# Patient Record
Sex: Male | Born: 1995 | Hispanic: No | Marital: Single | Smoking: Never smoker
Health system: Southern US, Community
[De-identification: ages and names within clinical notes are randomized; demographics above are authoritative.]

## PROBLEM LIST (undated history)

## (undated) DIAGNOSIS — Z789 Other specified health status: Secondary | ICD-10-CM

## (undated) DIAGNOSIS — D573 Sickle-cell trait: Secondary | ICD-10-CM

## (undated) HISTORY — PX: NO PAST SURGERIES: SHX2092

## (undated) HISTORY — DX: Sickle-cell trait: D57.3

---
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
## 2015-02-10 ENCOUNTER — Ambulatory Visit (INDEPENDENT_AMBULATORY_CARE_PROVIDER_SITE_OTHER): Payer: Medicaid Other | Admitting: Pediatrics

## 2015-02-10 ENCOUNTER — Encounter (INDEPENDENT_AMBULATORY_CARE_PROVIDER_SITE_OTHER): Payer: Self-pay

## 2015-02-10 ENCOUNTER — Encounter: Payer: Self-pay | Admitting: Pediatrics

## 2015-02-10 VITALS — Temp 102.8°F | Wt 137.4 lb

## 2015-02-10 DIAGNOSIS — B349 Viral infection, unspecified: Secondary | ICD-10-CM

## 2015-02-10 DIAGNOSIS — R509 Fever, unspecified: Secondary | ICD-10-CM

## 2015-02-10 LAB — POCT INFLUENZA B: Rapid Influenza B Ag: NEGATIVE

## 2015-02-10 LAB — POCT INFLUENZA A: Rapid Influenza A Ag: NEGATIVE

## 2015-02-10 MED ORDER — IBUPROFEN 600 MG PO TABS: ORAL_TABLET | ORAL | Status: DC

## 2015-02-10 MED ORDER — IBUPROFEN 200 MG PO TABS: 10.0000 mg/kg | ORAL_TABLET | Freq: Once | ORAL | Status: DC

## 2015-02-10 NOTE — Progress Notes (Signed)
Subjective:     Patient ID: Danny Fields, male   DOB: 09/16/1996, 19 y.o.   MRN: [id number]  HPI:  19 year old male in with mother.  Language line Swahili interpreter [phone] was used during visit.  Since yesterday he has felt hot and had headache and dry cough.  He vomited once last night but no diarrhea.  No blood in vomit.  Denies earache, sore throat diarrhea or nasal congestion.  He has decreased appetite but drinking, primarily soda.  His brother has "flu-like symptoms"    Was given Ibuprofen 200 mg last night for fever and headache.  Family are refugees from [place] via [place].  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  His last episode of Malaria was right before they came to the [place] last June.   Review of Systems  Constitutional: Positive for fever, activity change and appetite change.  HENT: Negative for congestion, ear pain, rhinorrhea and sore throat.   Eyes: Negative for photophobia, redness and visual disturbance.  Respiratory: Positive for cough.   Gastrointestinal: Positive for vomiting. Negative for nausea, abdominal pain, diarrhea and blood in stool.  Genitourinary: Negative for dysuria and decreased urine volume.  Skin: Negative for rash.  Neurological: Positive for headaches.       Objective:   Physical Exam  Constitutional: He appears well-developed and well-nourished.  HENT:  Mouth/Throat: Oropharynx is clear and moist. No oropharyngeal exudate.  Normal TM's  Eyes: Conjunctivae are normal.  Neck: Neck supple.  Cardiovascular: Normal rate and normal heart sounds.   No murmur heard. Pulmonary/Chest: Effort normal and breath sounds normal. He has no wheezes. He has no rales.  Abdominal: Soft. Bowel sounds are normal. He exhibits no distension and no mass. There is no tenderness.  Lymphadenopathy:    He has no cervical adenopathy.  Neurological: He is alert.  Skin: Skin is warm and dry. No rash noted.  Nursing note and vitals reviewed.       Assessment:     Flu-like illness     Plan:     Influenza test for A & B- negative CBC with diff Malaria Smear  Ibuprofen 600 mg given in clinic.   Rx per orders for Ibuprofen  Will call Mom with results of labwork and if any further testing needed.   Gregor HamsJacqueline Jasmaine Rochel, PPCNP-BC

## 2015-02-10 NOTE — Progress Notes (Signed)
Interpreter ID: [id number]

## 2015-02-10 NOTE — Patient Instructions (Signed)

## 2015-02-11 LAB — CBC WITH DIFFERENTIAL/PLATELET
Basophils Absolute: 0 10*3/uL (ref 0.0–0.1)
Basophils Relative: 0 % (ref 0–1)
Eosinophils Absolute: 0 10*3/uL (ref 0.0–0.7)
Eosinophils Relative: 0 % (ref 0–5)
HCT: 43.1 % (ref 39.0–52.0)
HEMOGLOBIN: 15.1 g/dL (ref 13.0–17.0)
LYMPHS ABS: 1 10*3/uL (ref 0.7–4.0)
Lymphocytes Relative: 14 % (ref 12–46)
MCH: 33.8 pg (ref 26.0–34.0)
MCHC: 35 g/dL (ref 30.0–36.0)
MCV: 96.4 fL (ref 78.0–100.0)
MONO ABS: 1.3 10*3/uL — AB (ref 0.1–1.0)
MPV: 11.2 fL (ref 8.6–12.4)
Monocytes Relative: 18 % — ABNORMAL HIGH (ref 3–12)
Neutro Abs: 5 10*3/uL (ref 1.7–7.7)
Neutrophils Relative %: 68 % (ref 43–77)
PLATELETS: 117 10*3/uL — AB (ref 150–400)
RBC: 4.47 MIL/uL (ref 4.22–5.81)
RDW: 12.5 % (ref 11.5–15.5)
WBC: 7.4 10*3/uL (ref 4.0–10.5)

## 2015-05-26 ENCOUNTER — Encounter: Payer: Self-pay | Admitting: Pediatrics

## 2015-05-26 ENCOUNTER — Encounter (INDEPENDENT_AMBULATORY_CARE_PROVIDER_SITE_OTHER): Payer: Self-pay

## 2015-05-26 ENCOUNTER — Ambulatory Visit (INDEPENDENT_AMBULATORY_CARE_PROVIDER_SITE_OTHER): Payer: No Typology Code available for payment source | Admitting: Pediatrics

## 2015-05-26 VITALS — Temp 98.4°F | Wt 142.6 lb

## 2015-05-26 DIAGNOSIS — Z23 Encounter for immunization: Secondary | ICD-10-CM | POA: Diagnosis not present

## 2015-05-26 DIAGNOSIS — H6983 Other specified disorders of Eustachian tube, bilateral: Secondary | ICD-10-CM

## 2015-05-26 DIAGNOSIS — Z113 Encounter for screening for infections with a predominantly sexual mode of transmission: Secondary | ICD-10-CM | POA: Diagnosis not present

## 2015-05-26 MED ORDER — FLUTICASONE PROPIONATE 50 MCG/ACT NA SUSP: 2.0000 | Freq: Every day | NASAL | Status: DC

## 2015-05-26 NOTE — Progress Notes (Signed)
History was provided by the patient and father.  Danny Fields is a 19 y.o. male who is here for ear pain.     HPI:  Pt reports bilateral ear pain L>R for about 1 week. The pain comes and goes and feels like a pressure. He denies fevers, cough, discharge. He does endorse some congestion and runny nose for the past week. He has not taken anything for this. It does not seem to get better or worse at any specific time or with any specific action.  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  The following portions of the patient's history were reviewed and updated as appropriate: allergies, current medications, past family history, past medical history, past social history, past surgical history and problem list.  Physical Exam:  Temp(Src) 98.4 F (36.9 C) (Temporal)  Wt 142 lb 9.6 oz (64.683 kg)  No blood pressure reading on file for this encounter. No LMP for male patient.    General:   alert, cooperative and no distress     Skin:   normal  Oral cavity:   lips, mucosa, and tongue normal; teeth and gums normal  Eyes:   sclerae white, pupils equal and reactive  Ears:   normal bilaterally, clear effusion, bulging without redness or purulence  Nose: clear discharge  Neck:  Neck appearance: Normal  Lungs:  clear to auscultation bilaterally  Heart:   regular rate and rhythm, S1, S2 normal, no murmur, click, rub or gallop   Abdomen:  soft, non-tender; bowel sounds normal; no masses,  no organomegaly  GU:  not examined  Extremities:   extremities normal, atraumatic, no cyanosis or edema  Neuro:  normal without focal findings, mental status, speech normal, alert and oriented x3, PERLA and muscle tone and strength normal and symmetric    Assessment/Plan: Ear pain - no signs of AOM, likely eustachian tube dysfunction given concurrent sinus complaints - trial of flonase - rtc if not improving in 1 week  - Immunizations today: IPV #3  - Follow-up visit in 2 months for [facility],  or sooner as needed.    Beverely Low, MD  05/26/2015

## 2015-05-26 NOTE — Patient Instructions (Signed)
Barotitis Media Barotitis media is inflammation of your middle ear. This occurs when the auditory tube (eustachian tube) leading from the back of your nose (nasopharynx) to your eardrum is blocked. This blockage may result from a cold, environmental allergies, or an upper respiratory infection. Unresolved barotitis media may lead to damage or hearing loss (barotrauma), which may become permanent. HOME CARE INSTRUCTIONS   Use medicines as recommended by your health care provider. Over-the-counter medicines will help unblock the canal and can help during times of air travel.  Do not put anything into your ears to clean or unplug them. Eardrops will not be helpful.  Do not swim, dive, or fly until your health care provider says it is all right to do so. If these activities are necessary, chewing gum with frequent, forceful swallowing may help. It is also helpful to hold your nose and gently blow to pop your ears for equalizing pressure changes. This forces air into the eustachian tube.  Only take over-the-counter or prescription medicines for pain, discomfort, or fever as directed by your health care provider.  A decongestant may be helpful in decongesting the middle ear and make pressure equalization easier. SEEK MEDICAL CARE IF:  You experience a serious form of dizziness in which you feel as if the room is spinning and you feel nauseated (vertigo).  Your symptoms only involve one ear. SEEK IMMEDIATE MEDICAL CARE IF:   You develop a severe headache, dizziness, or severe ear pain.  You have bloody or pus-like drainage from your ears.  You develop a fever.  Your problems do not improve or become worse. MAKE SURE YOU:   Understand these instructions.  Will watch your condition.  Will get help right away if you are not doing well or get worse. Document Released: 11/16/2000 Document Revised: 09/09/2013 Document Reviewed: 06/16/2013 ExitCare Patient Information 2015 [employer]. This  information is not intended to replace advice given to you by your health care provider. Make sure you discuss any questions you have with your health care provider.  

## 2015-05-27 LAB — GC/CHLAMYDIA PROBE AMP, URINE
Chlamydia, Swab/Urine, PCR: NEGATIVE
GC PROBE AMP, URINE: NEGATIVE

## 2015-07-21 ENCOUNTER — Ambulatory Visit (INDEPENDENT_AMBULATORY_CARE_PROVIDER_SITE_OTHER): Payer: No Typology Code available for payment source | Admitting: Pediatrics

## 2015-07-21 ENCOUNTER — Encounter (INDEPENDENT_AMBULATORY_CARE_PROVIDER_SITE_OTHER): Payer: Self-pay

## 2015-07-21 ENCOUNTER — Encounter: Payer: Self-pay | Admitting: Pediatrics

## 2015-07-21 VITALS — BP 110/84 | Ht 70.47 in | Wt 142.2 lb

## 2015-07-21 DIAGNOSIS — B360 Pityriasis versicolor: Secondary | ICD-10-CM | POA: Diagnosis not present

## 2015-07-21 DIAGNOSIS — Z68.41 Body mass index (BMI) pediatric, 5th percentile to less than 85th percentile for age: Secondary | ICD-10-CM

## 2015-07-21 DIAGNOSIS — Z0001 Encounter for general adult medical examination with abnormal findings: Secondary | ICD-10-CM | POA: Diagnosis not present

## 2015-07-21 DIAGNOSIS — Z00129 Encounter for routine child health examination without abnormal findings: Principal | ICD-10-CM

## 2015-07-21 MED ORDER — SELENIUM SULFIDE 2.5 % EX LOTN: 1.0000 "application " | TOPICAL_LOTION | Freq: Every day | CUTANEOUS | Status: DC

## 2015-07-21 NOTE — Progress Notes (Signed)
I saw and evaluated the patient, assisting with care as needed.  I reviewed the resident's note and agree with the findings and plan. Damein Gaunce, PPCNP-BC  

## 2015-07-21 NOTE — Progress Notes (Signed)
Routine Well-Adolescent Visit  PCP: TEBBEN,JACQUELINE, NP   History was provided by the patient.  Danny Fields is a 19 y.o. male who [facility].   Current concerns: rash on back.  History was elicited with the help of Swahili interpretor [place]. Danny Fields is an 18 year old male with a history of sickle cell trait, latent TB s/p treatment, and allergic rhinitis presenting to the clinic for 15 year old well child check. He was recently seen in the clinic in 05/2015 for ear pain and was treated with trial of flonase. Per patient, his ear pain is resolved. Today, his only concern is a new rash on his posterior neck and back. He has had this rash for approximately one month. It is not itchy or painful, and patient has not tried applying anything for relief. He has otherwise been well with no other concerns or complaints.   Family are refugees from [place] via [place].  Adolescent Assessment:  Confidentiality was discussed with the patient and if applicable, with caregiver as well.  Home and Environment:  Lives with: lives at home with mother and father Parental relations: good relationships Friends/Peers: Yes, has good friends at school Nutrition/Eating Behaviors: Eats balanced diet, drinks milk and soda Sports/Exercise:  Soccer  Education and Employment:  School Status: in 11th grade in regular classroom and is doing well (Bs) School History: School attendance is regular. Work: None Activities: Soccer team  With parent out of the room and confidentiality discussed:   Patient reports being comfortable and safe at school and at home? Yes  Smoking: no Secondhand smoke exposure? no Drugs/EtOH: None   Menstruation:   Menarche: not applicable in this male child.  Sexuality: Heterosexual Sexually active? no  sexual partners in last year:0 contraception use: abstinence Last STI Screening: 05/2015   Violence/Abuse: None Mood: Suicidality and Depression: good mood Weapons:  None  Screenings: The patient completed the Rapid Assessment for Adolescent Preventive Services screening questionnaire and the following topics were identified as risk factors and discussed: None  In addition, the following topics were discussed as part of anticipatory guidance healthy eating, exercise, seatbelt use and condom use.  PHQ-9 completed and results indicated Score of 3 (3 points because patient reports he has been sleeping too much because of summer vacation).  Physical Exam:  BP 110/84 mmHg  Ht 5' 10.47" (1.79 m)  Wt 142 lb 3.2 oz (64.501 kg)  BMI 20.13 kg/m2 Blood pressure percentiles are 12% systolic and 78% diastolic based on 2000 NHANES data.   General Appearance:   alert, oriented, no acute distress  HENT: Normocephalic, no obvious abnormality, conjunctiva clear  Mouth:   Poor dentition with discoloration  Neck:   Supple; thyroid: no enlargement, symmetric, no tenderness/mass/nodules  Lungs:   Clear to auscultation bilaterally, normal work of breathing  Heart:   Regular rate and rhythm, S1 normal, split S2, no murmurs;   Abdomen:   Soft, non-tender, no mass, or organomegaly  GU normal male genitals, no testicular masses or hernia  Musculoskeletal:   Tone and strength strong and symmetrical, all extremities               Lymphatic:   No cervical adenopathy  Skin/Hair/Nails:   Skin warm, dry and intact, no bruises or petechiae, rough hypopigmented patches with a slight scale across upper back and posterior neck  Neurologic:   Strength, gait, and coordination normal and age-appropriate    Assessment/Plan:  1. Encounter for routine child health examination with  abnormal findings - [name] has been doing well since his previous visit. Only complaint is about rash on back.  - Passed hearing screen - Passed vision screen - Provided patient with dental list as he has not seen a dentist here yet.   2. Tinea versicolor - Hypopigmented, rough patches of skin across  superior back and posterior neck with slight scale resembling tinea versicolor. - selenium sulfide (SELSUN) 2.5 % shampoo; Apply 1 application topically daily. Wet affected area and lather lotion on. Leave lotion on for 10 minutes and rinse. Repeat for 7 days.  Dispense: 118 mL; Refill: 1  3. BMI (body mass index), pediatric, 5% to less than 85% for age - BMI: is appropriate for age   - Follow-up visit in 1 year for next visit, or sooner as needed.   Minda Meo, MD

## 2015-07-21 NOTE — Patient Instructions (Signed)
Tinea Versicolor Tinea versicolor is a common yeast infection of the skin. This condition becomes known when the yeast on our skin starts to overgrow (yeast is a normal inhabitant on our skin). This condition is noticed as white or light brown patches on brown skin, and is more evident in the summer on tanned skin. These areas are slightly scaly if scratched. The light patches from the yeast become evident when the yeast creates "holes in your suntan". This is most often noticed in the summer. The patches are usually located on the chest, back, pubis, neck and body folds. However, it may occur on any area of body. Mild itching and inflammation (redness or soreness) may be present. DIAGNOSIS  The diagnosisof this is made clinically (by looking). Cultures from samples are usually not needed. Examination under the microscope may help. However, yeast is normally found on skin. The diagnosis still remains clinical. Examination under Wood's Ultraviolet Light can determine the extent of the infection. TREATMENT  This common infection is usually only of cosmetic (only a concern to your appearance). It is easily treated with dandruff shampoo used during showers or bathing. Vigorous scrubbing will eliminate the yeast over several days time. The light areas in your skin may remain for weeks or months after the infection is cured unless your skin is exposed to sunlight. The lighter or darker spots caused by the fungus that remain after complete treatment are not a sign of treatment failure; it will take a long time to resolve. Your caregiver may recommend a number of commercial preparations or medication by mouth if home care is not working. Recurrence is common and preventative medication may be necessary. This skin condition is not highly contagious. Special care is not needed to protect close friends and family members. Normal hygiene is usually enough. Follow up is required only if you develop complications (such as a  secondary infection from scratching), if recommended by your caregiver, or if no relief is obtained from the preparations used. Document Released: 11/16/2000 Document Revised: 02/11/2012 Document Reviewed: 12/29/2008 [facility] Patient Information 2015 [place], [place]. This information is not intended to replace advice given to you by your health care provider. Make sure you discuss any questions you have with your health care provider.  You have been prescribed Selenium sulfide lotion. To use this lotion, wet the affected area of skin (the area with the rash). Then rub the lotion onto the rash. Leave the lotion on for 10 minutes before rinsing it off. Repeat this for 7 days.

## 2015-08-04 ENCOUNTER — Encounter: Payer: Self-pay | Admitting: Pediatrics

## 2015-08-04 ENCOUNTER — Emergency Department (HOSPITAL_COMMUNITY)
Admission: EM | Admit: 2015-08-04 | Discharge: 2015-08-04 | Disposition: A | Discharge status: Home / Self Care | Payer: No Typology Code available for payment source | Attending: Emergency Medicine | Admitting: Emergency Medicine

## 2015-08-04 ENCOUNTER — Encounter (HOSPITAL_COMMUNITY): Payer: Self-pay | Admitting: *Deleted

## 2015-08-04 ENCOUNTER — Ambulatory Visit (INDEPENDENT_AMBULATORY_CARE_PROVIDER_SITE_OTHER): Payer: No Typology Code available for payment source | Admitting: Pediatrics

## 2015-08-04 VITALS — BP 100/64 | Temp 97.8°F | Ht 70.5 in | Wt 140.2 lb

## 2015-08-04 DIAGNOSIS — L02414 Cutaneous abscess of left upper limb: Secondary | ICD-10-CM | POA: Diagnosis not present

## 2015-08-04 DIAGNOSIS — Z862 Personal history of diseases of the blood and blood-forming organs and certain disorders involving the immune mechanism: Secondary | ICD-10-CM | POA: Diagnosis not present

## 2015-08-04 DIAGNOSIS — L02512 Cutaneous abscess of left hand: Secondary | ICD-10-CM | POA: Diagnosis present

## 2015-08-04 DIAGNOSIS — L0291 Cutaneous abscess, unspecified: Secondary | ICD-10-CM

## 2015-08-04 MED ORDER — SULFAMETHOXAZOLE-TRIMETHOPRIM 800-160 MG PO TABS: 2.0000 | ORAL_TABLET | Freq: Two times a day (BID) | ORAL | Status: DC

## 2015-08-04 NOTE — ED Notes (Signed)
Pt stable, ambulatory, states understanding of discharge instructions 

## 2015-08-04 NOTE — Patient Instructions (Signed)
There is an infection in your hand. We are sending you to a hand specialist to look at it. I have prescribed an antibiotics for you to start taking.  Take two tablets twice every day for a week. Please call immediately if you development new rash or fevers.

## 2015-08-04 NOTE — ED Provider Notes (Signed)
CSN: [id number]     Arrival date & time 08/04/15  1218 History   First MD Initiated Contact with Patient 08/04/15 1421     Chief Complaint  Patient presents with  . Hand Pain   HPI  Danny Fields is a 19 year old male presenting from his pediatrician with a hand abscess. Pt has had an abscess on the palmar aspect of his left hand for 2 weeks. Associated with tenderness and swelling of left hand. No injury or bug bite to area. Denies drainage from abscess. Pt can still move fingers and wrist and make a fist. Denies fevers, chills, erythema of the hand, arthralgias, nausea or vomiting.  Pediatrician felt that his hand needed an orthopedic follow up but he could not be fit in anywhere today. They instructed him to come to the [facility] ED for an ortho referral here.   Past Medical History  Diagnosis Date  . Sickle cell trait    History reviewed. No pertinent past surgical history. No family history on file. Social History  Substance Use Topics  . Smoking status: Never Smoker   . Smokeless tobacco: None  . Alcohol Use: No    Review of Systems  Constitutional: Negative for fever and chills.  Gastrointestinal: Negative for nausea and vomiting.  Musculoskeletal: Negative for myalgias, joint swelling and arthralgias.  Skin: Negative for color change and wound.  Neurological: Negative for weakness and numbness.      Allergies  Review of patient's allergies indicates no known allergies.  Home Medications   Prior to Admission medications   Medication Sig Start Date End Date Taking? Authorizing Provider  fluticasone (FLONASE) 50 MCG/ACT nasal spray Place 2 sprays into both nostrils daily. Patient not taking: Reported on 08/04/2015 05/26/15   Abram Sander, MD  selenium sulfide (SELSUN) 2.5 % shampoo Apply 1 application topically daily. Wet affected area and lather lotion on. Leave lotion on for 10 minutes and rinse. Repeat for 7 days. Patient not taking: Reported on 08/04/2015 07/21/15   Minda Meo, MD  sulfamethoxazole-trimethoprim (BACTRIM DS,SEPTRA DS) 800-160 MG per tablet Take 2 tablets by mouth 2 (two) times daily. 08/04/15   Jonetta Osgood, MD   BP 117/73 mmHg  Pulse 63  Temp(Src) 97.6 F (36.4 C) (Oral)  Resp 18  SpO2 100% Physical Exam  Constitutional: He is oriented to person, place, and time. He appears well-developed and well-nourished. No distress.  HENT:  Head: Normocephalic and atraumatic.  Neck: Normal range of motion.  Cardiovascular: Normal rate.   Pulmonary/Chest: Effort normal.  Musculoskeletal:  Full active ROM of left fingers and wrist. Able to make a fist  Neurological: He is alert and oriented to person, place, and time.  5/5 grip strength. Sensation intact over fingertips.  Skin: Skin is warm and dry.  Small abscess of left palmar aspect of hand along medial aspect of the hand. TTP with induration. No erythema, fluctuance or drainage.   Vitals reviewed.   ED Course  Procedures (including critical care time) Labs Review Labs Reviewed - No data to display  Imaging Review No results found. I have personally reviewed and evaluated these images and lab results as part of my medical decision-making.   EKG Interpretation None     Case discussed with Dr. Freida Busman  MDM   Final diagnoses:  Abscess   Pt presenting from his pediatrician's office with abscess of the left hand. Pediatrician wanted pt to see an orthopedist but he could not be fit in today at  local offices. Sent here for ortho consult. Pt with abscess for 2 weeks. No fevers, swelling of hand, or erythema of palm. Pt maintains full ROM and strength of his hand. Given ambulatory referral to hand surgeon and instructed to call for an appointment. Return precautions given in discharge paperwork. Pt agrees with this plan.     Alveta Heimlich, PA-C 08/04/15 1642  Lorre Nick, MD 08/05/15 [phone]

## 2015-08-04 NOTE — Progress Notes (Signed)
  Subjective:    Danny Fields is a 19 y.o. old male here  for Hand Pain .   [place] interpreter # [phone] Swahili interpreter used for visit  HPI  Pain and swelling in left hand for approximately one week - increasing in past few days and now feels warm. Very swollen. Larin does not remember an injury. Area is not draining spontaneously.  No fever, no chills.   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   Review of Systems  Constitutional: Negative for fever and chills.    Immunizations needed: none (has had 3 Tetanus vaccines, most rcent in April of this year)     Objective:    BP 100/64 mmHg  Temp(Src) 97.8 F (36.6 C) (Temporal)  Ht 5' 10.5" (1.791 m)  Wt 140 lb 3.2 oz (63.594 kg)  BMI 19.83 kg/m2 Physical Exam  Constitutional: He appears well-developed and well-nourished. No distress.  Musculoskeletal:  Swelling on palmar aspect of left hand midway along fifth metatarsal - area red and indurated, no fluctuance Painful to touch and somewhat warm puctum on area of swelling, but no spontaneous drainage       Assessment and Plan:     Danny Fields was seen today for Hand Pain .   Problem List Items Addressed This Visit    None    Visit Diagnoses    Abscess of left upper extremity    -  Primary    Relevant Orders    Ambulatory referral to Orthopedics      Abscess of left hand - seems deep and due to location do not feel comfortable draining it here in clinic. Our referral coordinator spoke with [facility] and [facility], neither of whom could work him in today. She additionally spoke with the office of the on call orthopedist for [facility] - recommended that Danny Fields got to the [facility] ED and ortho could see him there.  Rx given for TMP/SMX DS tabs - 2 BID x 7days. Spoke to ED nurse regarding patient.   Dory Peru, MD

## 2015-08-04 NOTE — ED Notes (Signed)
Pt sent here from pediatricians office.  Abscess x 2 weeks that they did not feel comfortable draining and feel consult to ortho is necessary.

## 2015-08-04 NOTE — Discharge Instructions (Signed)
-   Call [facility] to schedule an appointment - Return to ED with fevers, redness of the hand, inability to move the fingers or wrist, rapid swelling of the hand or wrist or further worsening of symptoms

## 2015-08-05 ENCOUNTER — Encounter: Payer: Self-pay | Admitting: Pediatrics

## 2015-08-05 ENCOUNTER — Ambulatory Visit (INDEPENDENT_AMBULATORY_CARE_PROVIDER_SITE_OTHER): Payer: No Typology Code available for payment source | Admitting: Pediatrics

## 2015-08-05 VITALS — Temp 98.0°F

## 2015-08-05 DIAGNOSIS — L02414 Cutaneous abscess of left upper limb: Secondary | ICD-10-CM | POA: Diagnosis not present

## 2015-08-05 MED ORDER — IBUPROFEN 200 MG PO TABS: 10.0000 mg/kg | ORAL_TABLET | Freq: Once | ORAL | Status: DC

## 2015-08-05 NOTE — Progress Notes (Signed)
  Subjective:    Marcelino is a 19 y.o. old male here for Abscess .   Swahili phone interpreter used.   HPI  Seen yesterday for abscess of left hand - sent to ED for ortho to see however ortho gave him a referral and discharged without having him see ortho. Westlee was confused and did not pick up the antibiotics rx yesterday. i phoned earlier this afternoon to check on him - hand more swollen and more painful today so I instructed him to come in.  Had him stop by pharmacy to pick up rx on his way in.   Increasing pain and swelling in left hand. Still no fevers or chills. Did take a dose of ibuprofen early this morning for pain.   Review of Systems  Constitutional: Negative for fever and chills.  no spontaneous drainage of site but has become increasingly painful to move 4th and 5th fingers of left hand.    Objective:    Temp(Src) 98 F (36.7 C) (Oral) Physical Exam  Left hand more swollen compared to yesterday - palmar aspect - induration and redness overlying mid fifth metacarpal - more fluctuant compared to yesterday with more of a head on it.   Betadine used to sterilize area - small amount of lidocaine (approx 0.2 ml) used to numb area - sterile scalpel used to open area with approx 2mm incision - scant amount of pus drained.  Sample of pus obtained and sent for culture.  Tolerated well but additional drainage of area limited by both location and pain. Wrapped with sterile gauze.     Assessment and Plan:     Koron was seen today for Abscess .   Problem List Items Addressed This Visit    None    Visit Diagnoses    Abscess of left upper extremity    -  Primary    Relevant Orders    Culture, routine-abscess      Abscess on hand - due to location of abscess hesitant to drain it, but attempted to have him seen by ortho yesterday and unable, now Friday of holiday weekend and no outpatient appts available for 4 days. Additionally, the abscess coalesced more so I&D done in clinic.  Bensen started Bactrim DS rx here in clinic today and dose of ibuprofen given. Discussed warm compresses and antibiotic dosing.  Hand specialist referral still outstanding and will plan for him to see hand specialist early next week.  Wound culture collected and sent  To return tomorrow here for recheck of area. Warm compresses and pain control reviewed. To ED if worsens or develops fever or chills.  Xyler voiced understanding.   Follow up here tomorrow with Dr Duffy Rhody - discussed patient with her and she saw the abscess after drainage.   Dory Peru, MD

## 2015-08-06 ENCOUNTER — Ambulatory Visit (INDEPENDENT_AMBULATORY_CARE_PROVIDER_SITE_OTHER): Payer: No Typology Code available for payment source | Admitting: Pediatrics

## 2015-08-06 ENCOUNTER — Encounter: Payer: Self-pay | Admitting: Pediatrics

## 2015-08-06 VITALS — Temp 98.4°F

## 2015-08-06 DIAGNOSIS — L02512 Cutaneous abscess of left hand: Secondary | ICD-10-CM | POA: Diagnosis not present

## 2015-08-06 NOTE — Patient Instructions (Signed)
Complete the antibiotic pills  Clean wound with soap and water.  Soak hand in warm water 2 times a day Saturday, Sunday and Monday. Cover with the antibiotic ointment and bandage when at risk for soiling.  You should get a call about the appointment with the specialist on Tuesday.  Go to the ED at [facility] if you have any trouble when the office is closed.

## 2015-08-06 NOTE — Progress Notes (Signed)
Subjective:     Patient ID: Danny Fields, male   DOB: 05-Sep-1996, 19 y.o.   MRN: [id number]  HPI Danny Fields is here today to follow-up on the abscess in his left hand. He was seen yesterday by Dr. Manson Passey with drainage and prescription of Bactrim DS for antibiotic coverage.  A Swahili interpreter assists [place] via the language line. Murel states he is feeling better today with less pain and less swelling of his hand. He is taking the antibiotic with good tolerance. No vomiting, diarrhea or rash.  Record from previous visit is reviewed.  Review of Systems  Constitutional: Negative for fever.  Gastrointestinal: Negative for vomiting and diarrhea.  Skin: Positive for wound. Negative for rash.       Objective:   Physical Exam  Constitutional: He appears well-developed and well-nourished. No distress.  Nursing note and vitals reviewed. Left hand examined. Wound is clean with no active drainage and no surrounding erythema. There is swelling around the wound and mildly at the dorsum of the hand. No tenderness to the touch. Carol can loosely fist and unfist the hand without observed difficulty. Normal ROM at wrist and digits.     Assessment:     1. Abscess of left hand excluding fingers and thumb   Much improved from yesterday (MD saw patient yesterday on exit for comparison purposes today) with absence of tenderness and improved mobility. He states swelling is much less.    Plan:     Wound is cleaned and redressed in the office with application of zinc bacitracin ointment, nonstick gauze to cover. Patient Plan:  Complete the antibiotic as prescribed. Soap and water clean up; cover when needed. Warm soaks bid for the next 3 days. Follow-up with hand surgeon per recommendation from Dr. Manson Passey. PRN acute care.  Danny Fields voiced understanding of recommendations and ability to follow through.  Danny Erie, MD

## 2015-08-11 ENCOUNTER — Telehealth: Payer: Self-pay | Admitting: Pediatrics

## 2015-08-11 LAB — CULTURE, ROUTINE-ABSCESS: Gram Stain: NONE SEEN

## 2015-08-11 NOTE — Telephone Encounter (Signed)
Called Danny Fields with [facility] interpreter [phone].  Unable to reach by phone - lvm to call us back.  Wound culture grew staph, but TMP/SMX resistant. If his hand is better, no need to change antibiotics.  If ongoing redness, drainage or swelling, needs to take a different antibiotic.  New antibiotic would be cephalexin 500 mg TID. Dory Peru, MD

## 2019-10-06 ENCOUNTER — Emergency Department (HOSPITAL_COMMUNITY)
Admission: EM | Admit: 2019-10-06 | Discharge: 2019-10-06 | Disposition: A | Discharge status: Home / Self Care | Payer: Self-pay | Attending: Emergency Medicine | Admitting: Emergency Medicine

## 2019-10-06 ENCOUNTER — Encounter (HOSPITAL_COMMUNITY): Payer: Self-pay | Admitting: *Deleted

## 2019-10-06 ENCOUNTER — Encounter (HOSPITAL_COMMUNITY): Payer: Self-pay | Admitting: Emergency Medicine

## 2019-10-06 ENCOUNTER — Emergency Department (HOSPITAL_COMMUNITY): Payer: Self-pay

## 2019-10-06 ENCOUNTER — Other Ambulatory Visit: Payer: Self-pay

## 2019-10-06 DIAGNOSIS — S42021A Displaced fracture of shaft of right clavicle, initial encounter for closed fracture: Secondary | ICD-10-CM | POA: Insufficient documentation

## 2019-10-06 DIAGNOSIS — W01198A Fall on same level from slipping, tripping and stumbling with subsequent striking against other object, initial encounter: Secondary | ICD-10-CM | POA: Insufficient documentation

## 2019-10-06 DIAGNOSIS — Y9366 Activity, soccer: Secondary | ICD-10-CM | POA: Insufficient documentation

## 2019-10-06 DIAGNOSIS — Z20828 Contact with and (suspected) exposure to other viral communicable diseases: Secondary | ICD-10-CM | POA: Insufficient documentation

## 2019-10-06 DIAGNOSIS — Y92322 Soccer field as the place of occurrence of the external cause: Secondary | ICD-10-CM | POA: Insufficient documentation

## 2019-10-06 DIAGNOSIS — Y999 Unspecified external cause status: Secondary | ICD-10-CM | POA: Insufficient documentation

## 2019-10-06 LAB — SARS CORONAVIRUS 2 BY RT PCR (HOSPITAL ORDER, PERFORMED IN ~~LOC~~ HOSPITAL LAB): SARS Coronavirus 2: NEGATIVE

## 2019-10-06 NOTE — ED Triage Notes (Signed)
Patient sent from [facility] for acute fracture of the right clavicle with small avulsed fragment. Patient has shoulder sling on and CD copy of images.

## 2019-10-06 NOTE — Consult Note (Signed)
Reason for Consult:Right clav fx Referring Physician: S Kohut  Danny Fields is an 23 y.o. male.  HPI: Danny Fields was playing soccer on Saturday and fell and hurt his shoulder. He went to UC today and was diagnosed with a clav fx and told to f/u with the ED. 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 He is RHD and works in a factory.  Past Medical History:  Diagnosis Date  . Sickle cell trait (H[facility])     History reviewed. No pertinent surgical history.  No family history on file.  Social History:  reports that he has never smoked. He does not have any smokeless tobacco history on file. He reports that he does not drink alcohol or use drugs.  Allergies: No Known Allergies  Medications: I have reviewed the patient's current medications.  No results found for this or any previous visit (from the past 48 hour(s)).  No results found.  Review of Systems  Constitutional: Negative for weight loss.  HENT: Negative for ear discharge, ear pain, hearing loss and tinnitus.   Eyes: Negative for blurred vision, double vision, photophobia and pain.  Respiratory: Negative for cough, sputum production and shortness of breath.   Cardiovascular: Negative for chest pain.  Gastrointestinal: Negative for abdominal pain, nausea and vomiting.  Genitourinary: Negative for dysuria, flank pain, frequency and urgency.  Musculoskeletal: Positive for joint pain (Right shoulder). Negative for back pain, falls, myalgias and neck pain.  Neurological: Negative for dizziness, tingling, sensory change, focal weakness, loss of consciousness and headaches.  Endo/Heme/Allergies: Does not bruise/bleed easily.  Psychiatric/Behavioral: Negative for depression, memory loss and substance abuse. The patient is not nervous/anxious.    Blood pressure 127/72, pulse (!) 57, temperature 98 F (36.7 C), temperature source Oral, resp. rate 12, SpO2 100 %. Physical Exam   Constitutional: He appears well-developed and well-nourished. No distress.  HENT:  Head: Normocephalic and atraumatic.  Eyes: Conjunctivae are normal. Right eye exhibits no discharge. Left eye exhibits no discharge. No scleral icterus.  Neck: Normal range of motion.  Cardiovascular: Normal rate and regular rhythm.  Respiratory: Effort normal. No respiratory distress.  Musculoskeletal:     Comments: Right shoulder, elbow, wrist, digits- no skin wounds, clav mod TTP, mild deformity midshaft, no instability, no blocks to motion  Sens  Ax/R/M/U intact  Mot   Ax/ R/ PIN/ M/ AIN/ U intact  Rad 2+  Neurological: He is alert.  Skin: Skin is warm and dry. He is not diaphoretic.  Psychiatric: He has a normal mood and affect. His behavior is normal.    Assessment/Plan: Right clav fx -- Plan ORIF tomorrow as OP with Dr. Haddix. NPO after MN.    [name], PA-C Orthopedic Surgery [phone] 10/06/2019, 12:50 PM  

## 2019-10-06 NOTE — ED Provider Notes (Signed)
[facility] EMERGENCY DEPARTMENT Provider Note   CSN: [id number] Arrival date & time: 10/06/19  1101     History   Chief Complaint Chief Complaint  Patient presents with  . Shoulder Injury    HPI Keyan Folson is a 23 y.o. male who presents to ED with a chief complaint of right shoulder pain.  He was playing soccer on 10/03/2019 when he fell onto his right shoulder.  Reports pain immediately afterwards.  Mild improvement noted with ibuprofen.  He was seen and evaluated in urgent care earlier today and sent to the ED for x-ray findings consistent with right clavicle fracture.  He was placed in a sling.     HPI  Past Medical History:  Diagnosis Date  . Sickle cell trait [facility])     Patient Active Problem List   Diagnosis Date Noted  . Sickle cell trait ([place]) 06/11/2014  . 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 No pertinent surgical history.      Home Medications    Prior to Admission medications   Medication Sig Start Date End Date Taking? Authorizing Provider  fluticasone (FLONASE) 50 MCG/ACT nasal spray Place 2 sprays into both nostrils daily. 05/26/15   Frazier Richards, MD  selenium sulfide (SELSUN) 2.5 % shampoo Apply 1 application topically daily. Wet affected area and lather lotion on. Leave lotion on for 10 minutes and rinse. Repeat for 7 days. 07/21/15   Verdie Shire, MD  sulfamethoxazole-trimethoprim (BACTRIM DS,SEPTRA DS) 800-160 MG per tablet Take 2 tablets by mouth 2 (two) times daily. 08/04/15   Dillon Bjork, MD    Family History No family history on file.  Social History Social History   Tobacco Use  . Smoking status: Never Smoker  Substance Use Topics  . Alcohol use: No  . Drug use: No     Allergies   Patient has no known allergies.   Review of Systems Review of Systems  Constitutional: Negative for chills and fever.  Gastrointestinal: Negative for diarrhea and vomiting.  Musculoskeletal:  Positive for myalgias.  Skin: Negative for wound.  Neurological: Negative for weakness and numbness.     Physical Exam Updated Vital Signs BP 127/72 (BP Location: Left Arm)   Pulse (!) 57   Temp 98 F (36.7 C) (Oral)   Resp 12   SpO2 100%   Physical Exam Vitals signs and nursing note reviewed.  Constitutional:      General: He is not in acute distress.    Appearance: He is well-developed. He is not diaphoretic.  HENT:     Head: Normocephalic and atraumatic.  Eyes:     General: No scleral icterus.    Conjunctiva/sclera: Conjunctivae normal.  Neck:     Musculoskeletal: Normal range of motion.   Pulmonary:     Effort: Pulmonary effort is normal. No respiratory distress.  Musculoskeletal:     Comments: Limited ROM of R shoulder 2/2 pain.  Sensation intact to light touch in right upper extremity.  2+ radial pulse palpated.  Skin:    Findings: No rash.  Neurological:     Mental Status: He is alert.      ED Treatments / Results  Labs (all labs ordered are listed, but only abnormal results are displayed) Labs Reviewed  SARS CORONAVIRUS 2 BY RT PCR (HOSPITAL ORDER, [facility])    EKG None  Radiology Dg Clavicle Right  Result Date: 10/06/2019 CLINICAL DATA:  Clavicle fracture EXAM: RIGHT  CLAVICLE - 2+ VIEWS COMPARISON:  None. FINDINGS: There is a comminuted fracture of the mid right clavicle with overlap fracture fragments. A 2.3 cm fracture fragment is seen along the inferior surface of the clavicle. No AC joint widening is noted. IMPRESSION: Comminuted midclavicular fracture with overlap of fracture fragments. We will Electronically Signed   By: Jonna Clark M.D.   On: 10/06/2019 13:14   Dg Shoulder Right  Result Date: 10/06/2019 CLINICAL DATA:  Right clavicle fracture. EXAM: RIGHT SHOULDER - 2+ VIEW COMPARISON:  08/13/2014. FINDINGS: Slightly displaced comminuted fracture of the mid right clavicle is noted. No other focal abnormality  identified. No pneumothorax. IMPRESSION: Slight displaced comminuted fracture of the mid right clavicle is noted. No other focal abnormality identified. Electronically Signed   By: Maisie Fus  Register   On: 10/06/2019 13:13    Procedures Procedures (including critical care time)  Medications Ordered in ED Medications - No data to display   Initial Impression / Assessment and Plan / ED Course  I have reviewed the triage vital signs and the nursing notes.  Pertinent labs & imaging results that were available during my care of the patient were reviewed by me and considered in my medical decision making (see chart for details).        23 year old male presents to the ED from urgent care after being diagnosed with a right clavicle fracture.  X-ray here confirms these findings.  Patient evaluated by orthopedic PA Earney Hamburg and will plan for OR tomorrow.  Covid test is pending.  Will advise him to continue with sling and NSAIDs.  Patient is hemodynamically stable, in NAD, and able to ambulate in the ED. Evaluation does not show pathology that would require ongoing emergent intervention or inpatient treatment. I explained the diagnosis to the patient. Pain has been managed and has no complaints prior to discharge. Patient is comfortable with above plan and is stable for discharge at this time. All questions were answered prior to disposition. Strict return precautions for returning to the ED were discussed. Encouraged follow up with PCP.   An After Visit Summary was printed and given to the patient.   Portions of this note were generated with Scientist, clinical (histocompatibility and immunogenetics). Dictation errors may occur despite best attempts at proofreading.   Final Clinical Impressions(s) / ED Diagnoses   Final diagnoses:  Closed displaced fracture of shaft of right clavicle, initial encounter    ED Discharge Orders    None       Dietrich Pates, PA-C 10/06/19 1328    Raeford Razor, MD 10/06/19 1505

## 2019-10-06 NOTE — H&P (View-Only) (Signed)
Reason for Consult:Right clav fx Referring Physician: Eugenio Hoes  Danny Fields is an 23 y.o. male.  HPI: Danny Fields was playing soccer on Saturday and fell and hurt his shoulder. He went to UC today and was diagnosed with a clav fx and told to f/u with the ED. 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 He is RHD and works in a [employer].  Past Medical History:  Diagnosis Date  . Sickle cell trait ([name])     History reviewed. No pertinent surgical history.  No family history on file.  Social History:  reports that he has never smoked. He does not have any smokeless tobacco history on file. He reports that he does not drink alcohol or use drugs.  Allergies: No Known Allergies  Medications: I have reviewed the patient's current medications.  No results found for this or any previous visit (from the past 48 hour(s)).  No results found.  Review of Systems  Constitutional: Negative for weight loss.  HENT: Negative for ear discharge, ear pain, hearing loss and tinnitus.   Eyes: Negative for blurred vision, double vision, photophobia and pain.  Respiratory: Negative for cough, sputum production and shortness of breath.   Cardiovascular: Negative for chest pain.  Gastrointestinal: Negative for abdominal pain, nausea and vomiting.  Genitourinary: Negative for dysuria, flank pain, frequency and urgency.  Musculoskeletal: Positive for joint pain (Right shoulder). Negative for back pain, falls, myalgias and neck pain.  Neurological: Negative for dizziness, tingling, sensory change, focal weakness, loss of consciousness and headaches.  Endo/Heme/Allergies: Does not bruise/bleed easily.  Psychiatric/Behavioral: Negative for depression, memory loss and substance abuse. The patient is not nervous/anxious.    Blood pressure 127/72, pulse (!) 57, temperature 98 F (36.7 C), temperature source Oral, resp. rate 12, SpO2 100 %. Physical Exam   Constitutional: He appears well-developed and well-nourished. No distress.  HENT:  Head: Normocephalic and atraumatic.  Eyes: Conjunctivae are normal. Right eye exhibits no discharge. Left eye exhibits no discharge. No scleral icterus.  Neck: Normal range of motion.  Cardiovascular: Normal rate and regular rhythm.  Respiratory: Effort normal. No respiratory distress.  Musculoskeletal:     Comments: Right shoulder, elbow, wrist, digits- no skin wounds, clav mod TTP, mild deformity midshaft, no instability, no blocks to motion  Sens  Ax/R/M/U intact  Mot   Ax/ R/ PIN/ M/ AIN/ U intact  Rad 2+  Neurological: He is alert.  Skin: Skin is warm and dry. He is not diaphoretic.  Psychiatric: He has a normal mood and affect. His behavior is normal.    Assessment/Plan: Right clav fx -- Plan ORIF tomorrow as OP with Dr. Doreatha Martin. NPO after MN.    Lisette Abu, PA-C Orthopedic Surgery [phone] 10/06/2019, 12:50 PM

## 2019-10-06 NOTE — ED Notes (Signed)
Patient transported to X-ray 

## 2019-10-06 NOTE — Discharge Instructions (Signed)
They will contact you regarding your surgery tomorrow. Return to the ED if you have additional injuries, numbness in arms.  Nothing to eat or drink after midnight tonight.

## 2019-10-06 NOTE — ED Notes (Signed)
Pt instructed to be NPO after midnight.

## 2019-10-06 NOTE — ED Notes (Signed)
Legrand Como, [place], at bedside.

## 2019-10-06 NOTE — Anesthesia Preprocedure Evaluation (Signed)
Anesthesia Evaluation  Patient identified by MRN, date of birth, ID band Patient awake    Reviewed: Allergy & Precautions, NPO status , Patient's Chart, lab work & pertinent test results  Airway Mallampati: II  TM Distance: >3 FB Neck ROM: Full    Dental no notable dental hx.    Pulmonary neg pulmonary ROS,    Pulmonary exam normal breath sounds clear to auscultation       Cardiovascular negative cardio ROS Normal cardiovascular exam Rhythm:Regular Rate:Normal     Neuro/Psych negative neurological ROS  negative psych ROS   GI/Hepatic negative GI ROS, Neg liver ROS,   Endo/Other  negative endocrine ROS  Renal/GU negative Renal ROS  negative genitourinary   Musculoskeletal negative musculoskeletal ROS (+)   Abdominal   Peds negative pediatric ROS (+)  Hematology negative hematology ROS (+) Sickle cell trait ,   Anesthesia Other Findings R clavicular fx s/p fall while playing soccer  Reproductive/Obstetrics negative OB ROS                             Anesthesia Physical Anesthesia Plan  ASA: II  Anesthesia Plan: General   Post-op Pain Management:    Induction: Intravenous  PONV Risk Score and Plan: Ondansetron, Dexamethasone, Treatment may vary due to age or medical condition and Midazolam  Airway Management Planned: Oral ETT  Additional Equipment: None  Intra-op Plan:   Post-operative Plan: Extubation in OR  Informed Consent: I have reviewed the patients History and Physical, chart, labs and discussed the procedure including the risks, benefits and alternatives for the proposed anesthesia with the patient or authorized representative who has indicated his/her understanding and acceptance.     Dental advisory given  Plan Discussed with: CRNA  Anesthesia Plan Comments:         Anesthesia Quick Evaluation

## 2019-10-06 NOTE — Progress Notes (Signed)
Danny Fields denies chest pain or shortness of breath. Danny Fields tested negative for Covid today and has been in quarantine with this family

## 2019-10-07 ENCOUNTER — Ambulatory Visit (HOSPITAL_COMMUNITY): Payer: Self-pay | Admitting: Anesthesiology

## 2019-10-07 ENCOUNTER — Ambulatory Visit (HOSPITAL_COMMUNITY): Payer: Self-pay

## 2019-10-07 ENCOUNTER — Encounter (HOSPITAL_COMMUNITY)
Admission: RE | Disposition: A | Discharge status: Home / Self Care | Payer: Self-pay | Source: Home / Self Care | Attending: Student

## 2019-10-07 ENCOUNTER — Ambulatory Visit (HOSPITAL_COMMUNITY)
Admission: RE | Admit: 2019-10-07 | Discharge: 2019-10-07 | Disposition: A | Discharge status: Home / Self Care | Payer: Self-pay | Attending: Student | Admitting: Student

## 2019-10-07 ENCOUNTER — Encounter (HOSPITAL_COMMUNITY): Payer: Self-pay | Admitting: General Practice

## 2019-10-07 DIAGNOSIS — S42021A Displaced fracture of shaft of right clavicle, initial encounter for closed fracture: Secondary | ICD-10-CM | POA: Insufficient documentation

## 2019-10-07 DIAGNOSIS — Z419 Encounter for procedure for purposes other than remedying health state, unspecified: Secondary | ICD-10-CM

## 2019-10-07 DIAGNOSIS — Y9366 Activity, soccer: Secondary | ICD-10-CM | POA: Insufficient documentation

## 2019-10-07 DIAGNOSIS — T148XXA Other injury of unspecified body region, initial encounter: Secondary | ICD-10-CM

## 2019-10-07 DIAGNOSIS — D573 Sickle-cell trait: Secondary | ICD-10-CM | POA: Insufficient documentation

## 2019-10-07 HISTORY — PX: ORIF CLAVICULAR FRACTURE: SHX5055

## 2019-10-07 HISTORY — DX: Other specified health status: Z78.9

## 2019-10-07 LAB — CBC
HCT: 46.5 % (ref 39.0–52.0)
Hemoglobin: 16.2 g/dL (ref 13.0–17.0)
MCH: 34 pg (ref 26.0–34.0)
MCHC: 34.8 g/dL (ref 30.0–36.0)
MCV: 97.7 fL (ref 80.0–100.0)
Platelets: 137 10*3/uL — ABNORMAL LOW (ref 150–400)
RBC: 4.76 MIL/uL (ref 4.22–5.81)
RDW: 11.8 % (ref 11.5–15.5)
WBC: 4.2 10*3/uL (ref 4.0–10.5)
nRBC: 0 % (ref 0.0–0.2)

## 2019-10-07 SURGERY — OPEN REDUCTION INTERNAL FIXATION (ORIF) CLAVICULAR FRACTURE
Anesthesia: General | Laterality: Right

## 2019-10-07 MED ORDER — FENTANYL CITRATE (PF) 250 MCG/5ML IJ SOLN
INTRAMUSCULAR | Status: AC
  Filled 2019-10-07: qty 5

## 2019-10-07 MED ORDER — OXYCODONE HCL 5 MG/5ML PO SOLN: 5.0000 mg | Freq: Once | ORAL | Status: DC | PRN

## 2019-10-07 MED ORDER — DEXMEDETOMIDINE HCL IN NACL 400 MCG/100ML IV SOLN
INTRAVENOUS | Status: DC | PRN
  Administered 2019-10-07 (×4): 4 ug via INTRAVENOUS

## 2019-10-07 MED ORDER — VANCOMYCIN HCL POWD
Status: DC | PRN
  Administered 2019-10-07: 10:00:00 1000 mg

## 2019-10-07 MED ORDER — EPHEDRINE SULFATE-NACL 50-0.9 MG/10ML-% IV SOSY
PREFILLED_SYRINGE | INTRAVENOUS | Status: DC | PRN
  Administered 2019-10-07 (×2): 5 mg via INTRAVENOUS

## 2019-10-07 MED ORDER — ROCURONIUM BROMIDE 100 MG/10ML IV SOLN
INTRAVENOUS | Status: DC | PRN
  Administered 2019-10-07: 50 mg via INTRAVENOUS
  Administered 2019-10-07: 10 mg via INTRAVENOUS

## 2019-10-07 MED ORDER — DEXAMETHASONE SODIUM PHOSPHATE 10 MG/ML IJ SOLN
INTRAMUSCULAR | Status: DC | PRN
  Administered 2019-10-07: 5 mg via INTRAVENOUS

## 2019-10-07 MED ORDER — 0.9 % SODIUM CHLORIDE (POUR BTL) OPTIME
TOPICAL | Status: DC | PRN
  Administered 2019-10-07: 09:00:00 1000 mL

## 2019-10-07 MED ORDER — PROPOFOL 10 MG/ML IV BOLUS
INTRAVENOUS | Status: AC
  Filled 2019-10-07: qty 40

## 2019-10-07 MED ORDER — BACITRACIN ZINC 500 UNIT/GM EX OINT
TOPICAL_OINTMENT | CUTANEOUS | Status: AC
  Filled 2019-10-07: qty 28.35

## 2019-10-07 MED ORDER — LIDOCAINE 2% (20 MG/ML) 5 ML SYRINGE
INTRAMUSCULAR | Status: DC | PRN
  Administered 2019-10-07: 40 mg via INTRAVENOUS

## 2019-10-07 MED ORDER — ONDANSETRON HCL 4 MG/2ML IJ SOLN
INTRAMUSCULAR | Status: DC | PRN
  Administered 2019-10-07: 4 mg via INTRAVENOUS

## 2019-10-07 MED ORDER — HYDROCODONE-ACETAMINOPHEN 5-325 MG PO TABS: 2.0000 | ORAL_TABLET | Freq: Four times a day (QID) | ORAL | 0 refills | Status: AC | PRN

## 2019-10-07 MED ORDER — CHLORHEXIDINE GLUCONATE 4 % EX LIQD: 60.0000 mL | Freq: Once | CUTANEOUS | Status: DC

## 2019-10-07 MED ORDER — HYDROMORPHONE HCL 1 MG/ML IJ SOLN: 0.2500 mg | INTRAMUSCULAR | Status: DC | PRN

## 2019-10-07 MED ORDER — ONDANSETRON HCL 4 MG/2ML IJ SOLN
INTRAMUSCULAR | Status: AC
  Filled 2019-10-07: qty 2

## 2019-10-07 MED ORDER — ACETAMINOPHEN 500 MG PO TABS
1000.0000 mg | ORAL_TABLET | Freq: Once | ORAL | Status: AC
  Administered 2019-10-07: 07:00:00 1000 mg via ORAL
  Filled 2019-10-07: qty 2

## 2019-10-07 MED ORDER — LACTATED RINGERS IV SOLN
INTRAVENOUS | Status: DC | PRN
  Administered 2019-10-07 (×2): via INTRAVENOUS

## 2019-10-07 MED ORDER — PROMETHAZINE HCL 25 MG/ML IJ SOLN: 6.2500 mg | INTRAMUSCULAR | Status: DC | PRN

## 2019-10-07 MED ORDER — MIDAZOLAM HCL 5 MG/5ML IJ SOLN
INTRAMUSCULAR | Status: DC | PRN
  Administered 2019-10-07 (×2): 1 mg via INTRAVENOUS

## 2019-10-07 MED ORDER — BUPIVACAINE LIPOSOME 1.3 % IJ SUSP
INTRAMUSCULAR | Status: DC | PRN
  Administered 2019-10-07: 10 mL via PERINEURAL

## 2019-10-07 MED ORDER — FENTANYL CITRATE (PF) 250 MCG/5ML IJ SOLN
INTRAMUSCULAR | Status: DC | PRN
  Administered 2019-10-07 (×2): 25 ug via INTRAVENOUS
  Administered 2019-10-07: 50 ug via INTRAVENOUS

## 2019-10-07 MED ORDER — OXYCODONE HCL 5 MG PO TABS: 5.0000 mg | ORAL_TABLET | Freq: Once | ORAL | Status: DC | PRN

## 2019-10-07 MED ORDER — MEPERIDINE HCL 25 MG/ML IJ SOLN: 6.2500 mg | INTRAMUSCULAR | Status: DC | PRN

## 2019-10-07 MED ORDER — ROPIVACAINE HCL 5 MG/ML IJ SOLN
INTRAMUSCULAR | Status: DC | PRN
  Administered 2019-10-07: 10 mL via PERINEURAL

## 2019-10-07 MED ORDER — KETOROLAC TROMETHAMINE 30 MG/ML IJ SOLN: 30.0000 mg | Freq: Once | INTRAMUSCULAR | Status: DC | PRN

## 2019-10-07 MED ORDER — ROCURONIUM BROMIDE 10 MG/ML (PF) SYRINGE
PREFILLED_SYRINGE | INTRAVENOUS | Status: AC
  Filled 2019-10-07: qty 10

## 2019-10-07 MED ORDER — CEFAZOLIN SODIUM 1 G IJ SOLR
INTRAMUSCULAR | Status: AC
  Filled 2019-10-07: qty 20

## 2019-10-07 MED ORDER — DEXAMETHASONE SODIUM PHOSPHATE 10 MG/ML IJ SOLN
INTRAMUSCULAR | Status: AC
  Filled 2019-10-07: qty 1

## 2019-10-07 MED ORDER — LIDOCAINE 2% (20 MG/ML) 5 ML SYRINGE
INTRAMUSCULAR | Status: AC
  Filled 2019-10-07: qty 5

## 2019-10-07 MED ORDER — SUGAMMADEX SODIUM 200 MG/2ML IV SOLN
INTRAVENOUS | Status: DC | PRN
  Administered 2019-10-07: 200 mg via INTRAVENOUS

## 2019-10-07 MED ORDER — VANCOMYCIN HCL 1000 MG IV SOLR
INTRAVENOUS | Status: AC
  Filled 2019-10-07: qty 1000

## 2019-10-07 MED ORDER — DEXMEDETOMIDINE HCL IN NACL 200 MCG/50ML IV SOLN
INTRAVENOUS | Status: AC
  Filled 2019-10-07: qty 50

## 2019-10-07 MED ORDER — PROPOFOL 10 MG/ML IV BOLUS
INTRAVENOUS | Status: DC | PRN
  Administered 2019-10-07: 150 mg via INTRAVENOUS

## 2019-10-07 MED ORDER — CEFAZOLIN SODIUM-DEXTROSE 2-4 GM/100ML-% IV SOLN
2.0000 g | INTRAVENOUS | Status: AC
  Administered 2019-10-07: 09:00:00 2 g via INTRAVENOUS
  Filled 2019-10-07: qty 100

## 2019-10-07 MED ORDER — MIDAZOLAM HCL 2 MG/2ML IJ SOLN
INTRAMUSCULAR | Status: AC
  Filled 2019-10-07: qty 2

## 2019-10-07 MED FILL — Vancomycin HCl For IV Soln 1 GM (Base Equivalent): INTRAVENOUS | Qty: 1000 | Status: AC

## 2019-10-07 SURGICAL SUPPLY — 53 items
BIT DRILL CLAV 2.2X135 (BIT) ×1
BIT DRILL CLAV ALPS 2.7X145 (BIT) ×2 IMPLANT
BIT DRILL CLAV LONG 2.2X135 (BIT) IMPLANT
BNDG COHESIVE 4X5 TAN STRL (GAUZE/BANDAGES/DRESSINGS) IMPLANT
BRUSH SCRUB EZ PLAIN DRY (MISCELLANEOUS) ×4 IMPLANT
CHLORAPREP W/TINT 26 (MISCELLANEOUS) ×2 IMPLANT
COVER SURGICAL LIGHT HANDLE (MISCELLANEOUS) ×4 IMPLANT
DERMABOND ADVANCED (GAUZE/BANDAGES/DRESSINGS) ×2
DERMABOND ADVANCED .7 DNX12 (GAUZE/BANDAGES/DRESSINGS) ×2 IMPLANT
DRAPE C-ARM 42X72 X-RAY (DRAPES) ×2 IMPLANT
DRAPE INCISE IOBAN 66X45 STRL (DRAPES) ×2 IMPLANT
DRAPE ORTHO SPLIT 77X108 STRL (DRAPES) ×2
DRAPE SURG ORHT 6 SPLT 77X108 (DRAPES) ×2 IMPLANT
DRAPE U-SHAPE 47X51 STRL (DRAPES) ×4 IMPLANT
DRSG MEPILEX BORDER 4X8 (GAUZE/BANDAGES/DRESSINGS) ×2 IMPLANT
ELECT REM PT RETURN 9FT ADLT (ELECTROSURGICAL) ×2
ELECTRODE REM PT RTRN 9FT ADLT (ELECTROSURGICAL) ×1 IMPLANT
GLOVE BIO SURGEON STRL SZ 6.5 (GLOVE) ×6 IMPLANT
GLOVE BIO SURGEON STRL SZ7.5 (GLOVE) ×6 IMPLANT
GLOVE BIOGEL PI IND STRL 6.5 (GLOVE) ×1 IMPLANT
GLOVE BIOGEL PI IND STRL 7.5 (GLOVE) ×1 IMPLANT
GLOVE BIOGEL PI INDICATOR 6.5 (GLOVE) ×1
GLOVE BIOGEL PI INDICATOR 7.5 (GLOVE) ×1
GOWN STRL REUS W/ TWL LRG LVL3 (GOWN DISPOSABLE) ×2 IMPLANT
GOWN STRL REUS W/TWL LRG LVL3 (GOWN DISPOSABLE) ×2
KIT BASIN OR (CUSTOM PROCEDURE TRAY) ×2 IMPLANT
KIT TURNOVER KIT B (KITS) ×2 IMPLANT
MANIFOLD NEPTUNE II (INSTRUMENTS) ×2 IMPLANT
NDL HYPO 25GX1X1/2 BEV (NEEDLE) IMPLANT
NEEDLE HYPO 25GX1X1/2 BEV (NEEDLE) IMPLANT
NS IRRIG 1000ML POUR BTL (IV SOLUTION) ×2 IMPLANT
PACK GENERAL/GYN (CUSTOM PROCEDURE TRAY) ×2 IMPLANT
PAD ARMBOARD 7.5X6 YLW CONV (MISCELLANEOUS) ×4 IMPLANT
PLATE CLAV SUP RT 8H 900 NS (Plate) ×1 IMPLANT
SCREW 2.7X14MM (Screw) ×2 IMPLANT
SCREW BN 14X2.7XNONLOCK 3 LD (Screw) IMPLANT
SCREW CORT LP 3.5X14 (Screw) ×3 IMPLANT
SCREW CORT LP T15 3.5X16 (Screw) ×2 IMPLANT
SCREW TIS LP 3.5X18 NS (Screw) ×1 IMPLANT
SLING ARM IMMOBILIZER LRG (SOFTGOODS) IMPLANT
SLING ARM IMMOBILIZER MED (SOFTGOODS) IMPLANT
STAPLER VISISTAT 35W (STAPLE) ×2 IMPLANT
STOCKINETTE IMPERVIOUS 9X36 MD (GAUZE/BANDAGES/DRESSINGS) IMPLANT
SUCTION FRAZIER HANDLE 10FR (MISCELLANEOUS) ×1
SUCTION TUBE FRAZIER 10FR DISP (MISCELLANEOUS) ×1 IMPLANT
SUT MNCRL AB 3-0 PS2 27 (SUTURE) ×2 IMPLANT
SUT VIC AB 0 CT1 27 (SUTURE) ×1
SUT VIC AB 0 CT1 27XBRD ANBCTR (SUTURE) ×1 IMPLANT
SUT VIC AB 2-0 CT1 27 (SUTURE) ×1
SUT VIC AB 2-0 CT1 TAPERPNT 27 (SUTURE) ×1 IMPLANT
SYR CONTROL 10ML LL (SYRINGE) IMPLANT
TOWEL GREEN STERILE (TOWEL DISPOSABLE) ×2 IMPLANT
WATER STERILE IRR 1000ML POUR (IV SOLUTION) ×2 IMPLANT

## 2019-10-07 NOTE — Anesthesia Postprocedure Evaluation (Signed)
Anesthesia Post Note  Patient: Shawnte Demarest  Procedure(s) Performed: OPEN REDUCTION INTERNAL FIXATION (ORIF) CLAVICULAR FRACTURE (Right )     Patient location during evaluation: PACU Anesthesia Type: General and Regional Level of consciousness: awake and alert, oriented and patient cooperative Pain management: pain level controlled Vital Signs Assessment: post-procedure vital signs reviewed and stable Respiratory status: spontaneous breathing, nonlabored ventilation and respiratory function stable Cardiovascular status: blood pressure returned to baseline and stable Postop Assessment: no apparent nausea or vomiting Anesthetic complications: no    Last Vitals:  Vitals:   10/07/19 1045 10/07/19 1100  BP: 128/73 122/69  Pulse: 70 71  Resp: (!) 24 (!) 23  Temp:    SpO2: 100% 100%    Last Pain:  Vitals:   10/07/19 1100  PainSc: 0-No pain                 Jarome Matin Colter Magowan

## 2019-10-07 NOTE — Op Note (Signed)
Orthopaedic Surgery Operative Note (CSN: [id number] ) Date of Surgery: 10/07/2019  Admit Date: 10/07/2019   Diagnoses: Pre-Op Diagnoses: Right displaced clavicle fracture  Post-Op Diagnosis: Same  Procedures: CPT 23515-Open reduction internal fixation of right clavicle fracture  Surgeons : Primary: Roby Lofts, MD  Assistant: Ulyses Southward, PA-C  Location: OR 6   Anesthesia:General  Antibiotics: Ancef 2g preop   Tourniquet time:None  Estimated Blood Loss:50 mL  Complications:* No complications entered in OR log *   Specimens:* No specimens in log *   Implants: Implant Name Type Inv. Item Serial No. Manufacturer Lot No. LRB No. Used Action  SCREW 2.7X14MM - [id number] Screw SCREW 2.7X14MM  [employer](ORTH,TRAU,BIO,SG)  Right 2 Implanted  PLATE CLAV SUP RT 8H 900 NS - [id number] Plate PLATE CLAV SUP RT 8H 900 NS  [employer](ORTH,TRAU,BIO,SG)  Right 1 Implanted  SCREW CORT LP 3.5X14 - [id number] Screw SCREW CORT LP 3.5X14  [employer](ORTH,TRAU,BIO,SG)  Right 3 Implanted  SCREW CORT LP T15 3.5X16 - [id number] Screw SCREW CORT LP T15 3.5X16  [employer](ORTH,TRAU,BIO,SG)  Right 2 Implanted  SCREW TIS LP 3.5X18 NS - [id number] Screw SCREW TIS LP 3.5X18 NS  [employer](ORTH,TRAU,BIO,SG)  Right 1 Implanted     Indications for Surgery: 23 year old right-hand-dominant male that sustained a right clavicle fracture while playing soccer.  Presents emergency room where x-rays showed a displaced right clavicle fracture.  Due to his age and activity level I recommended proceeding with open reduction internal fixation.  Risks and benefits were discussed with the patient.  Risks included but not limited to bleeding, infection, malunion, nonunion, hardware failure, hardware irritation, nerve and blood vessel injury, even the need for revision surgery, and the possibility anesthetic complications.  The patient agreed to proceed with surgery and consent was obtained.  Operative  Findings: Open reduction internal fixation of right clavicle fracture using [employer] ALPS clavicular plate  Procedure: The patient was identified in the preoperative holding area. Consent was confirmed with the patient and their family and all questions were answered. The operative extremity was marked after confirmation with the patient. The patient was then brought back to the operating room by our anesthesia colleagues. They were placed under general anesthesia and carefully transferred over to a radiolucent flat top top. A bump was placed under the shoulder blades to better access the clavicle.  The head was turned to the contralateral side. The left upper extremity and chest wall was prepped and draped in usual sterile fashion. A timeout was performed to verify the patient, the procedure and the extremity. Preoperative antibiotics were dosed.  An incision was made along the superior border of the clavicle. It was carried through skin and subcutaneous tissue. The platsyma and overlying fascia was split in line with the incision. Careful dissection was performed to attempt to save the sensory nerves crossing the field. The fracture was then encountered. It was irrigated and the bone ends were cleared to visualize the fracture and reduction.  The clot was removed and reduction was performed using reduction tenaculum.  I then placed a anterior to posterior 2.7 mm [employer] lag screws.  Once provisional fixation was obtained I remove the clamps and placed a superior [employer] ALPS clavicle plate.  I drilled and fixed the lateral and medial segment.  I confirmed adequate placement on fluoroscopy and then I placed 2 more screws both the lateral and medial.  Fluoroscopy was used to confirm fixation and final x-rays. The incision was then irrigated. One  gram of vancomycin powder was placed in the wound. The fascia and muscle was closed with 0 vicryl. The remainder of the skin was closed with 2-0  vicryl and 2-100monocryl and the skin was sealed with dermabond. The incision was dressed with mepilex dressing. The patient was then awoken from anesthesia and taken to the PACU in stable condition.  Post Op Plan/Instructions: Patient will be on nonweightbearing to the right upper extremity.  He will of unrestricted range of motion of the shoulder.  Return in 1 to 2 weeks for repeat x-rays and wound check.  No DVT prophylaxis is needed in this healthy upper extremity patient.  I was present and performed the entire surgery.  Patrecia Pace, PA-C did assist me throughout the case. An assistant was necessary given the difficulty in approach, maintenance of reduction and ability to instrument the fracture.   Katha Hamming, MD [facility]

## 2019-10-07 NOTE — Anesthesia Procedure Notes (Addendum)
Anesthesia Regional Block: Interscalene brachial plexus block   Pre-Anesthetic Checklist: ,, timeout performed, Correct Patient, Correct Site, Correct Laterality, Correct Procedure, Correct Position, site marked, Risks and benefits discussed,  Surgical consent,  Pre-op evaluation,  At surgeon's request and post-op pain management  Laterality: Right  Prep: Maximum Sterile Barrier Precautions used, chloraprep       Needles:  Injection technique: Single-shot  Needle Type: Echogenic Stimulator Needle     Needle Length: 9cm  Needle Gauge: 22     Additional Needles:   Procedures:,,,, ultrasound used (permanent image in chart),,,,  Narrative:  Start time: 10/07/2019 8:00 AM End time: 10/07/2019 8:10 AM Injection made incrementally with aspirations every 5 mL.  Performed by: Personally  Anesthesiologist: Pervis Hocking, DO  Additional Notes: Monitors applied. No increased pain on injection. No increased resistance to injection. Injection made in 5cc increments. Good needle visualization. Patient tolerated procedure well.

## 2019-10-07 NOTE — Anesthesia Procedure Notes (Signed)
Procedure Name: Intubation Date/Time: 10/07/2019 8:33 AM Performed by: Janene Harvey, CRNA Pre-anesthesia Checklist: Patient identified, Emergency Drugs available, Suction available and Patient being monitored Patient Re-evaluated:Patient Re-evaluated prior to induction Oxygen Delivery Method: Circle system utilized Preoxygenation: Pre-oxygenation with 100% oxygen Induction Type: IV induction Ventilation: Mask ventilation without difficulty Laryngoscope Size: Mac and 4 Grade View: Grade I Tube type: Oral Tube size: 7.5 mm Number of attempts: 1 Airway Equipment and Method: Stylet and Oral airway Placement Confirmation: ETT inserted through vocal cords under direct vision,  positive ETCO2 and breath sounds checked- equal and bilateral Secured at: 23 cm Tube secured with: Tape Dental Injury: Teeth and Oropharynx as per pre-operative assessment

## 2019-10-07 NOTE — Discharge Instructions (Addendum)
[facility] Discharge Instructions   General Discharge Instructions  WEIGHT BEARING STATUS: Non-weightbearing right upper extremity  RANGE OF MOTION/ACTIVITY: Okay to start gentle range of motion of shoulder and elbow  Wound Care: Okay to remove surgical dressing on Friday 10/09/2019. You will see skin glue over your incisions, this will flake off on its own over the next several weeks. Okay to start showering on Saturday if no drainage from incisions. Incisions can be left open to air if there is no drainage.   DVT/PE prophylaxis: None  Diet: as you were eating previously.  Can use over the counter stool softeners and bowel preparations, such as Miralax, to help with bowel movements.  Narcotics can be constipating.  Be sure to drink plenty of fluids  PAIN MEDICATION USE AND EXPECTATIONS  You have likely been given narcotic medications to help control your pain.  After a traumatic event that results in an fracture (broken bone) with or without surgery, it is ok to use narcotic pain medications to help control one's pain.  We understand that everyone responds to pain differently and each individual patient will be evaluated on a regular basis for the continued need for narcotic medications. Ideally, narcotic medication use should last no more than 6-8 weeks (coinciding with fracture healing).   As a patient it is your responsibility as well to monitor narcotic medication use and report the amount and frequency you use these medications when you come to your office visit.   We would also advise that if you are using narcotic medications, you should take a dose prior to therapy to maximize you participation.  IF YOU ARE ON NARCOTIC MEDICATIONS IT IS NOT PERMISSIBLE TO OPERATE A MOTOR VEHICLE (MOTORCYCLE/CAR/TRUCK/MOPED) OR HEAVY MACHINERY DO NOT MIX NARCOTICS WITH OTHER CNS (CENTRAL NERVOUS SYSTEM) DEPRESSANTS SUCH AS ALCOHOL   STOP SMOKING OR USING NICOTINE PRODUCTS!!!!  As  discussed nicotine severely impairs your body's ability to heal surgical and traumatic wounds but also impairs bone healing.  Wounds and bone heal by forming microscopic blood vessels (angiogenesis) and nicotine is a vasoconstrictor (essentially, shrinks blood vessels).  Therefore, if vasoconstriction occurs to these microscopic blood vessels they essentially disappear and are unable to deliver necessary nutrients to the healing tissue.  This is one modifiable factor that you can do to dramatically increase your chances of healing your injury.    (This means no smoking, no nicotine gum, patches, etc)  DO NOT USE NONSTEROIDAL ANTI-INFLAMMATORY DRUGS (NSAID'S)  Using products such as Advil (ibuprofen), Aleve (naproxen), Motrin (ibuprofen) for additional pain control during fracture healing can delay and/or prevent the healing response.  If you would like to take over the counter (OTC) medication, Tylenol (acetaminophen) is ok.  However, some narcotic medications that are given for pain control contain acetaminophen as well. Therefore, you should not exceed more than 4000 mg of tylenol in a day if you do not have liver disease.  Also note that there are may OTC medicines, such as cold medicines and allergy medicines that my contain tylenol as well.  If you have any questions about medications and/or interactions please ask your doctor/PA or your pharmacist.      ICE AND ELEVATE INJURED/OPERATIVE EXTREMITY  Using ice and elevating the injured extremity above your heart can help with swelling and pain control.  Icing in a pulsatile fashion, such as 20 minutes on and 20 minutes off, can be followed.    Do not place ice directly on skin. Make sure  there is a barrier between to skin and the ice pack.    Using frozen items such as frozen peas works well as the conform nicely to the are that needs to be iced.  USE AN ACE WRAP OR TED HOSE FOR SWELLING CONTROL  In addition to icing and elevation, Ace wraps or TED  hose are used to help limit and resolve swelling.  It is recommended to use Ace wraps or TED hose until you are informed to stop.    When using Ace Wraps start the wrapping distally (farthest away from the body) and wrap proximally (closer to the body)   Example: If you had surgery on your leg or thing and you do not have a splint on, start the ace wrap at the toes and work your way up to the thigh        If you had surgery on your upper extremity and do not have a splint on, start the ace wrap at your fingers and work your way up to the upper arm   CALL THE OFFICE WITH ANY QUESTIONS OR CONCERNS: [phone]   VISIT OUR WEBSITE FOR ADDITIONAL INFORMATION: [email or web address]     Discharge Wound Care Instructions  Do NOT apply any ointments, solutions or lotions to pin sites or surgical wounds.  These prevent needed drainage and even though solutions like hydrogen peroxide kill bacteria, they also damage cells lining the pin sites that help fight infection.  Applying lotions or ointments can keep the wounds moist and can cause them to breakdown and open up as well. This can increase the risk for infection. When in doubt call the office.  Surgical incisions should be dressed daily.  If any drainage is noted, use one layer of adaptic, then gauze, Kerlix, and an ace wrap.  Once the incision is completely dry and without drainage, it may be left open to air out.  Showering may begin 36-48 hours later.  Cleaning gently with soap and water.  Traumatic wounds should be dressed daily as well.    One layer of adaptic, gauze, Kerlix, then ace wrap.  The adaptic can be discontinued once the draining has ceased    If you have a wet to dry dressing: wet the gauze with saline the squeeze as much saline out so the gauze is moist (not soaking wet), place moistened gauze over wound, then place a dry gauze over the moist one, followed by Kerlix wrap, then ace wrap.

## 2019-10-07 NOTE — Interval H&P Note (Signed)
History and Physical Interval Note:  10/07/2019 8:17 AM  Danny Fields  has presented today for surgery, with the diagnosis of Right clav fx.  The various methods of treatment have been discussed with the patient and family. After consideration of risks, benefits and other options for treatment, the patient has consented to  Procedure(s): OPEN REDUCTION INTERNAL FIXATION (ORIF) CLAVICULAR FRACTURE (Right) as a surgical intervention.  The patient's history has been reviewed, patient examined, no change in status, stable for surgery.  I have reviewed the patient's chart and labs.  Questions were answered to the patient's satisfaction.     Lennette Bihari P Haylei Cobin

## 2019-10-07 NOTE — Transfer of Care (Signed)
Immediate Anesthesia Transfer of Care Note  Patient: Muath Hallam  Procedure(s) Performed: OPEN REDUCTION INTERNAL FIXATION (ORIF) CLAVICULAR FRACTURE (Right )  Patient Location: PACU  Anesthesia Type:General  Level of Consciousness: drowsy  Airway & Oxygen Therapy: Patient Spontanous Breathing and Patient connected to face mask oxygen  Post-op Assessment: Report given to RN and Post -op Vital signs reviewed and stable  Post vital signs: Reviewed  Last Vitals:  Vitals Value Taken Time  BP 120/74 10/07/19 1028  Temp    Pulse 79 10/07/19 1032  Resp 25 10/07/19 1032  SpO2 100 % 10/07/19 1032  Vitals shown include unvalidated device data.  Last Pain:  Vitals:   10/07/19 0714  PainSc: 3       Patients Stated Pain Goal: 3 (10/04/10 1735)  Complications: No apparent anesthesia complications

## 2019-10-09 ENCOUNTER — Encounter (HOSPITAL_COMMUNITY): Payer: Self-pay | Admitting: Student

## 2019-10-12 ENCOUNTER — Encounter (HOSPITAL_COMMUNITY): Payer: Self-pay | Admitting: Student

## 2020-08-16 IMAGING — RF DG C-ARM 1-60 MIN
1 series · 7 of 7 positions shown · non-contrast
Comparison: Right clavicle radiographs 10/06/2019

CLINICAL DATA: Right clavicle fracture fixation.

EXAM:
RIGHT CLAVICLE - 2+ VIEWS; DG C-ARM 1-60 MIN

[Series 1: run · 7 of 7 slices shown]
[im 1/7]
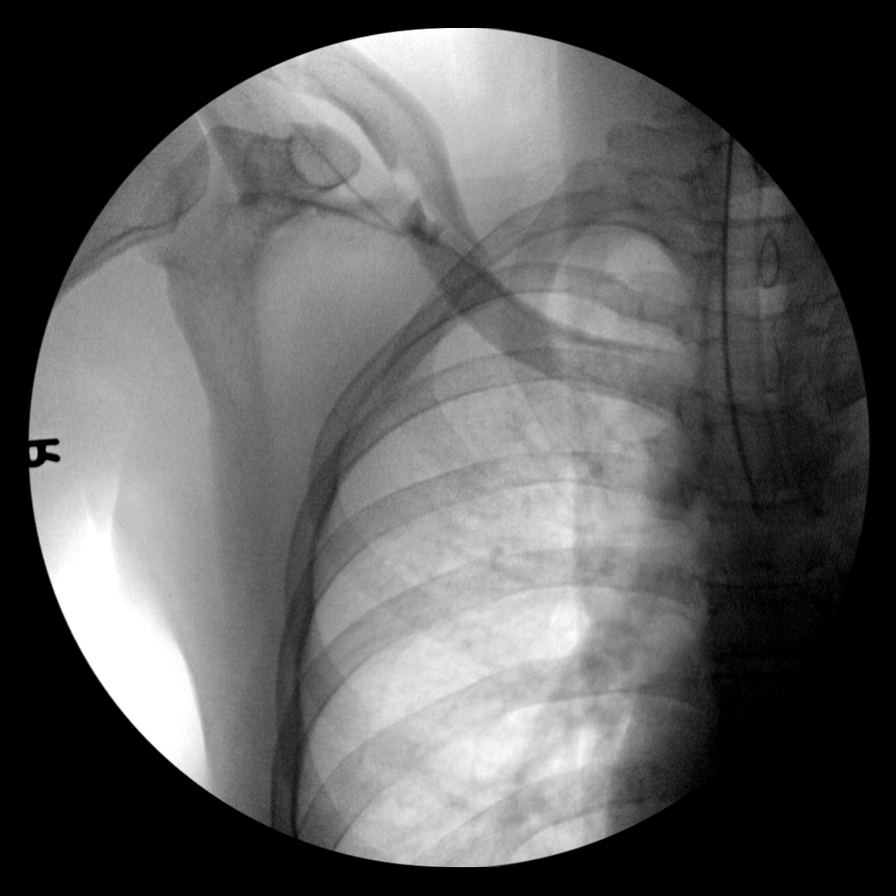
[im 2/7]
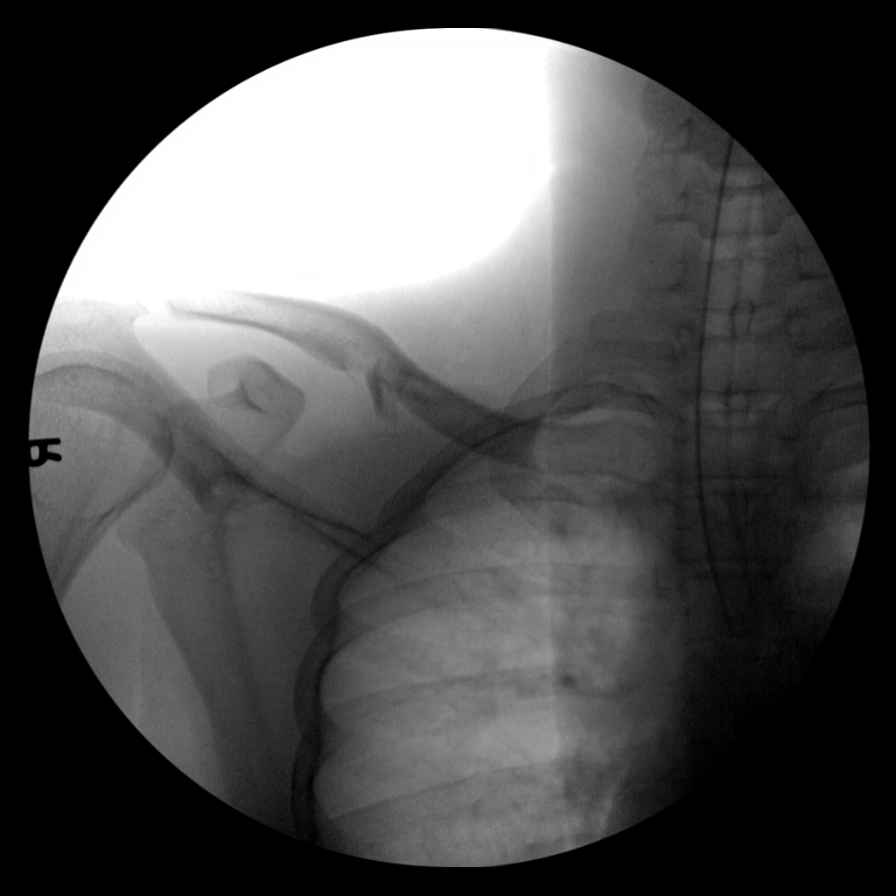
[im 3/7]
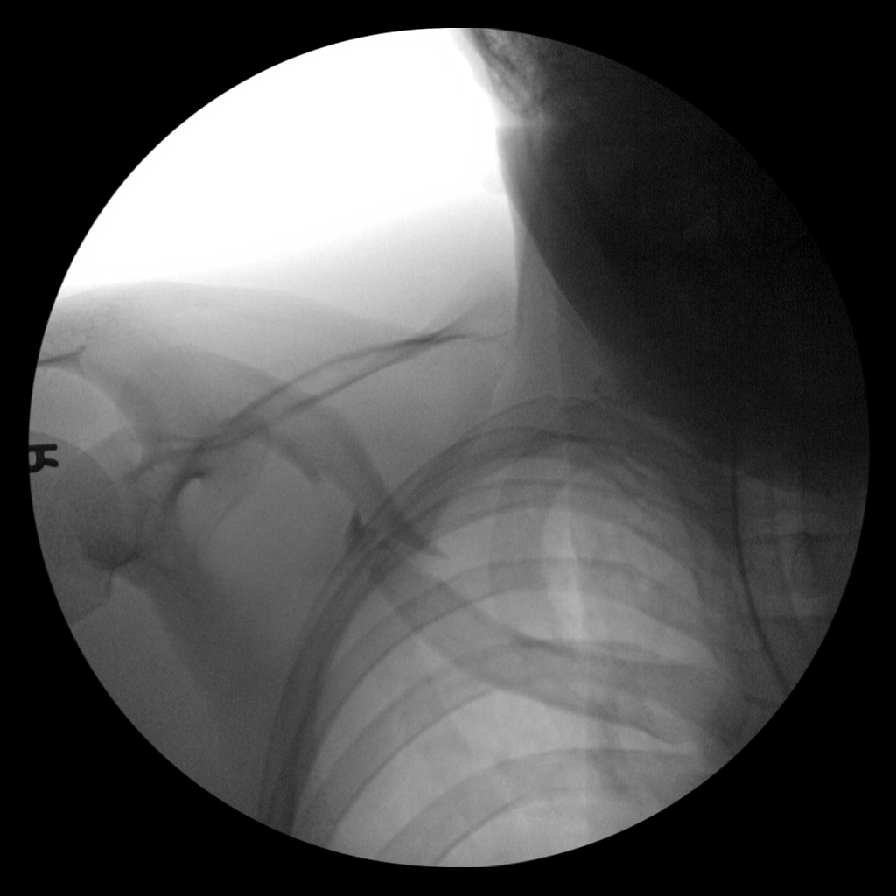
[im 4/7]
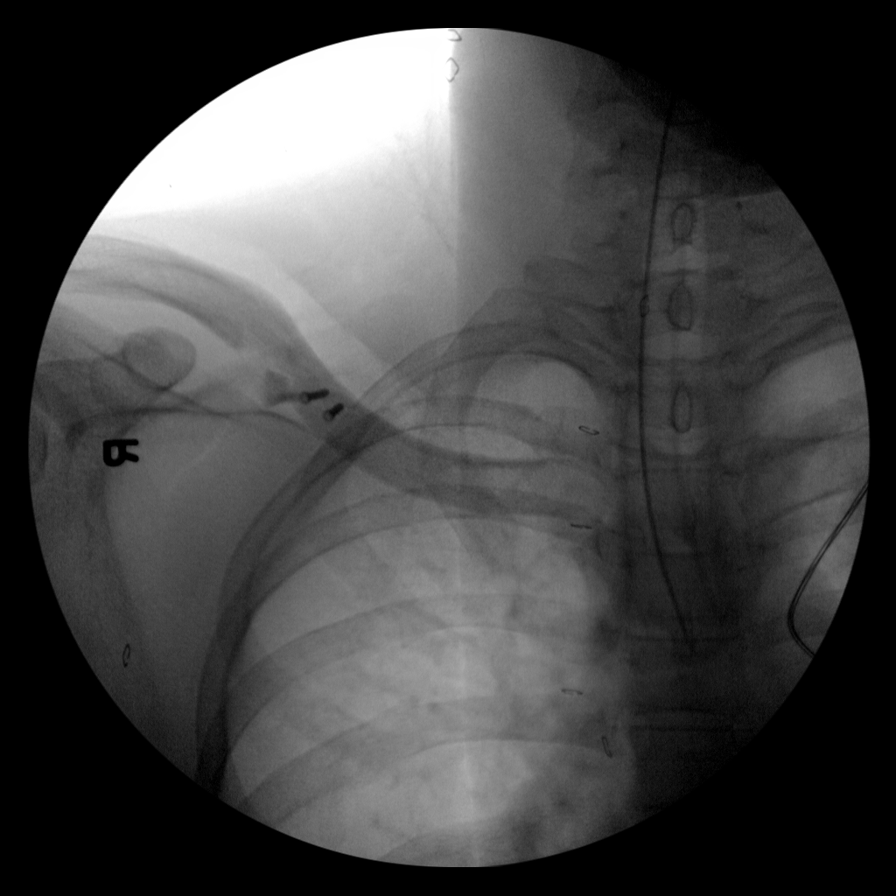
[im 5/7]
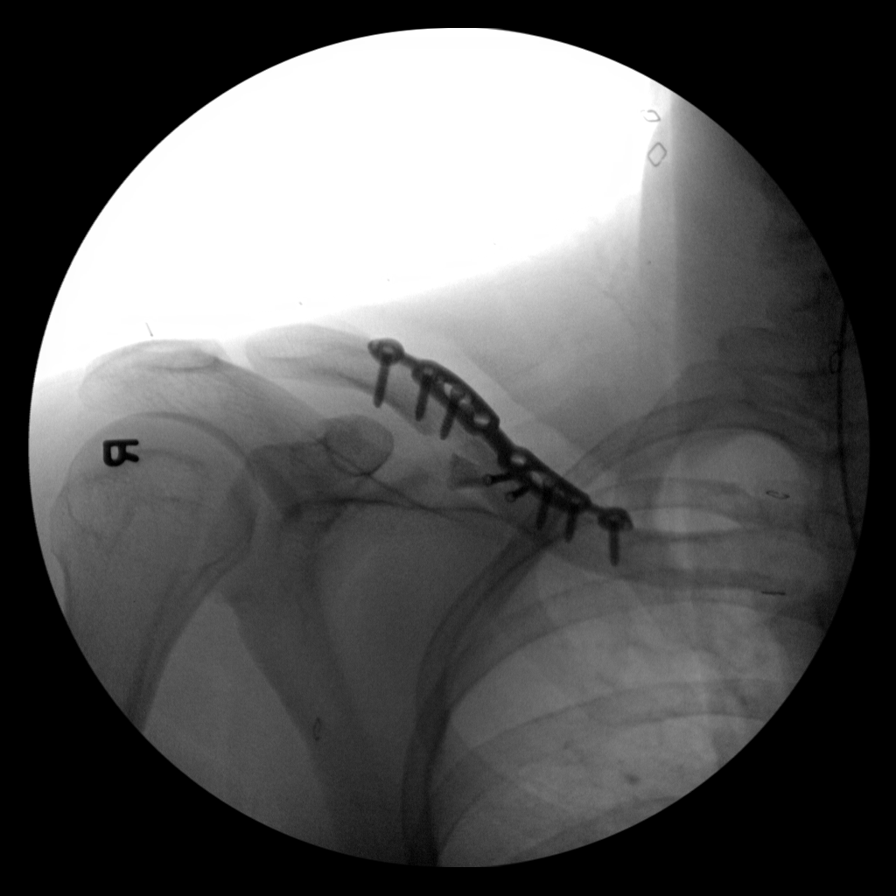
[im 6/7]
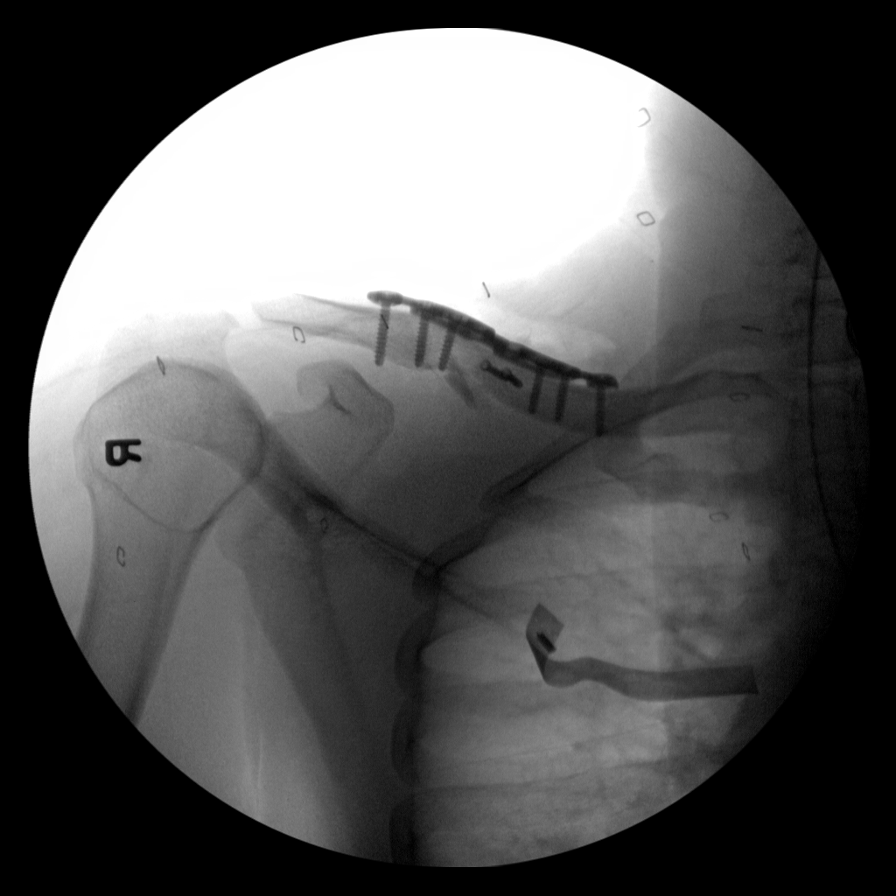
[im 7/7]
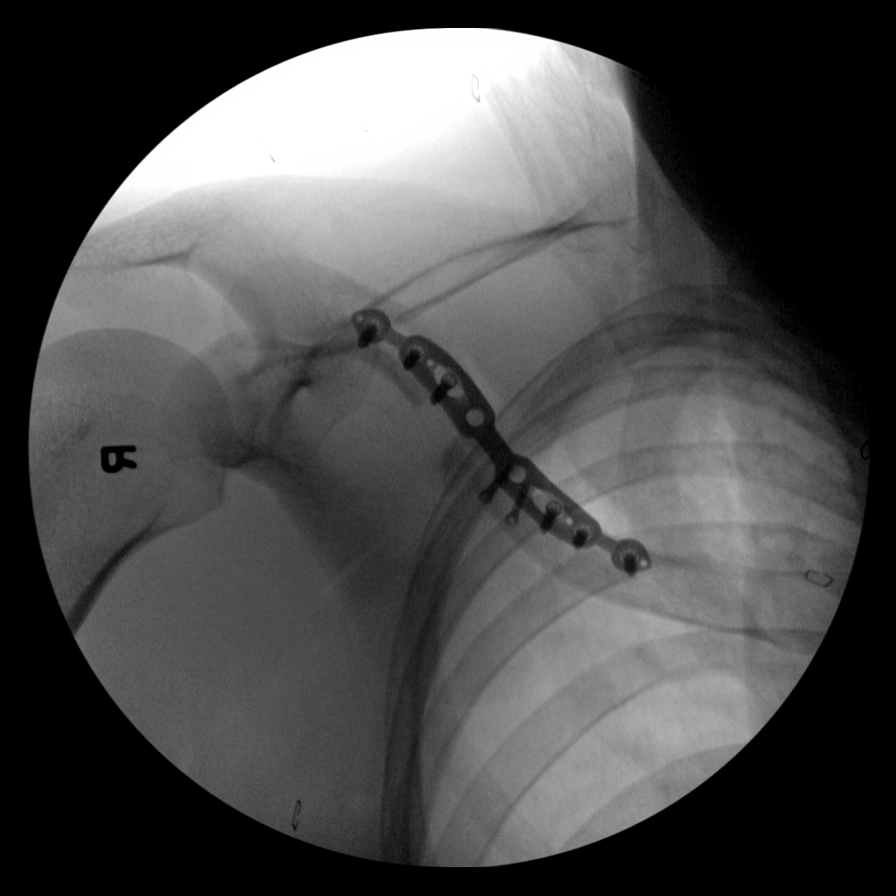

[7 of 7 positions shown; findings below may reference images not displayed]

FINDINGS: Multiple fluoroscopic spot images demonstrate open reduction and
internal fixation of a right mid clavicle fracture with a 2 small
free screws and a compression plate and multiple screws.
IMPRESSION: Open reduction and internal fixation of a right mid clavicle
fracture. Anatomic alignment without complicating features.

## 2020-08-16 IMAGING — DX DG CLAVICLE*R*
2 series · 2 of 2 positions shown · non-contrast
Comparison: None.

CLINICAL DATA: Status post right clavicular fracture ORIF

EXAM:
RIGHT CLAVICLE - 2+ VIEWS

[clavicle ap]
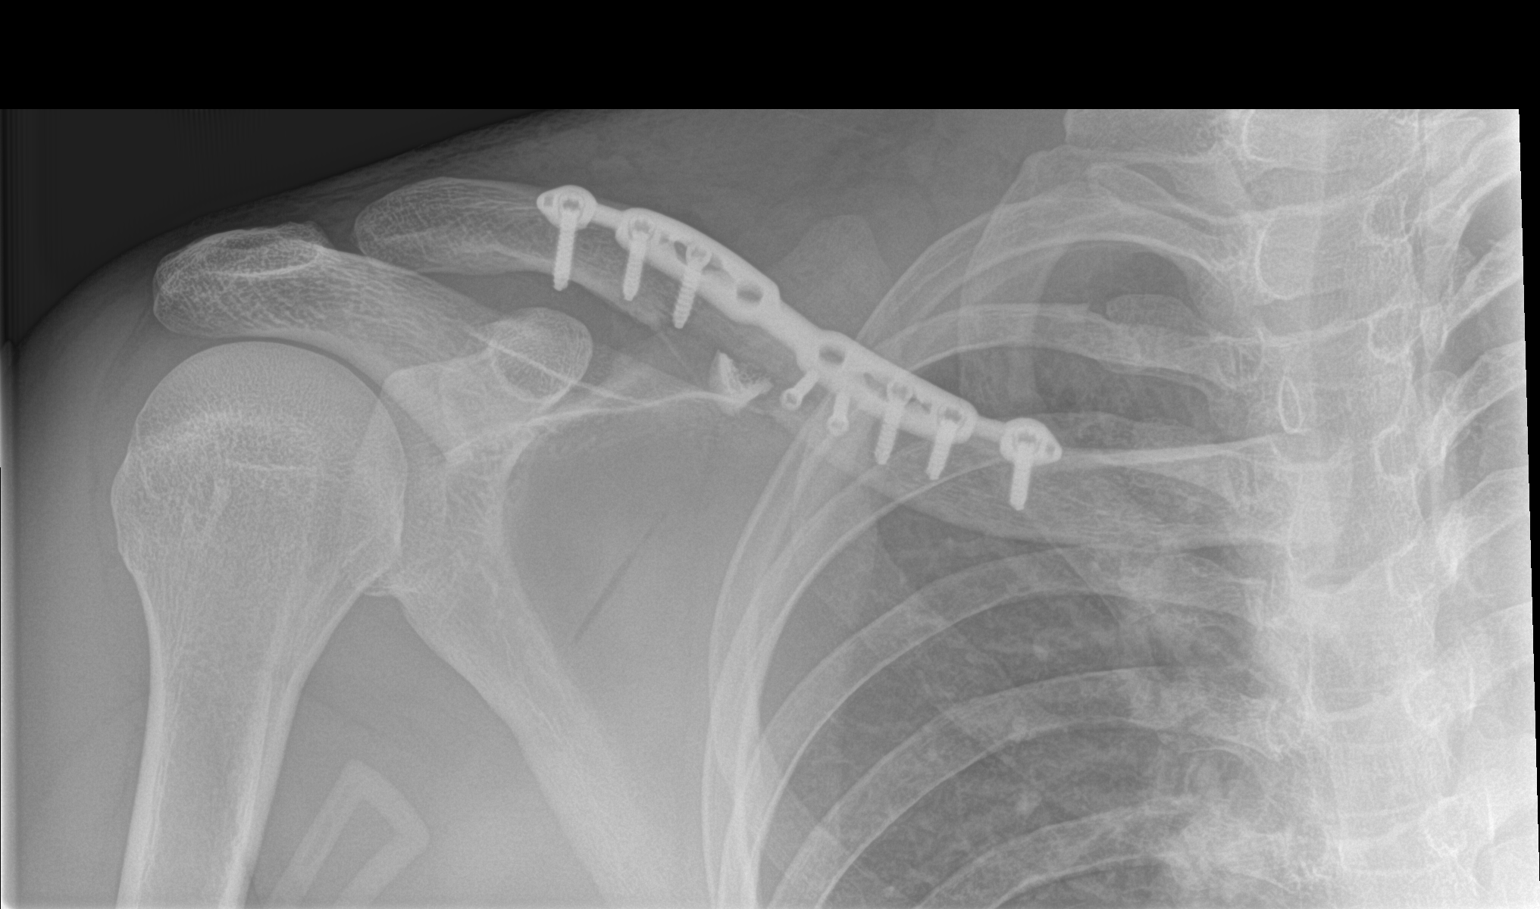

[clavicle axial]
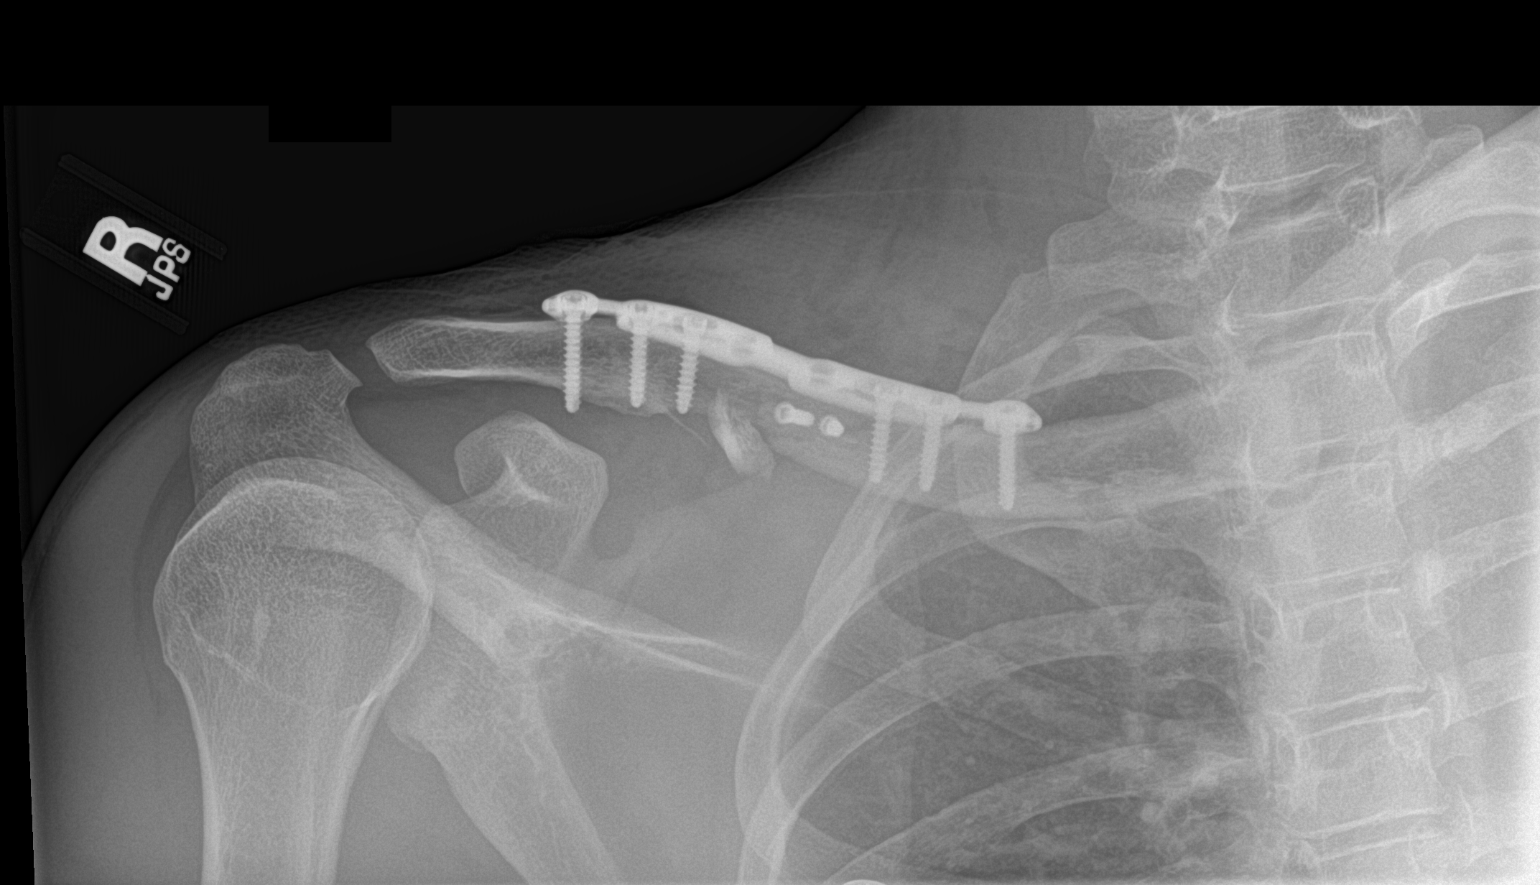

[2 of 2 positions shown; findings below may reference images not displayed]

FINDINGS: Right midclavicular comminuted fracture transfixed with a metallic
plate with multiple interlocking screws without hardware failure or
complication. Alignment is anatomic. There is a small 15 mm bony
fragment along the inferior margin which is mildly displaced.
IMPRESSION: Interval right clavicular fracture ORIF in near anatomic alignment.
